# Patient Record
Sex: Female | Born: 1995 | Race: White | Hispanic: No | Marital: Married | Smoking: Never smoker
Health system: Southern US, Community
[De-identification: ages and names within clinical notes are randomized; demographics above are authoritative.]

## PROBLEM LIST (undated history)

## (undated) ENCOUNTER — Inpatient Hospital Stay (HOSPITAL_COMMUNITY): Payer: Self-pay

## (undated) DIAGNOSIS — D649 Anemia, unspecified: Secondary | ICD-10-CM

## (undated) HISTORY — DX: Anemia, unspecified: D64.9

## (undated) HISTORY — PX: NO PAST SURGERIES: SHX2092

---

## 2018-03-11 NOTE — L&D Delivery Note (Signed)
OB/GYN Faculty Practice Delivery Note  Laura Schmidt is a 23 y.o. G1P1001 s/p NSVD at [redacted]w[redacted]d. She was admitted for SROM and early labor.   ROM: 12h 75m with clear fluid GBS Status: Negative/-- (11/02 1208) Maximum Maternal Temperature: 99.3 F    Labor Progress: . Patient arrived at 2 cm dilation and initially managed expectantly. Subsequently she was 3cm and augmented with pitocin and progressed to fully dilated and had uncomplicated NSVD.   Delivery Date/Time: 02/09/2019 at 1339 Delivery: Called to room and patient was complete and pushing. Head delivered in ROA position. No nuchal cord present. Shoulder and body delivered in usual fashion. Infant with spontaneous cry, placed on mother's abdomen, dried and stimulated. Cord clamped x 2 after 1-minute delay, and cut by father of baby. Cord blood drawn. Placenta delivered spontaneously with gentle cord traction. Fundus firm with massage and Pitocin. Labia, perineum, vagina, and cervix inspected with very minor 3a laceration that skimmed through the top of the EAS capsule and was repaired in usual fashion. There was also a L labial that was bleeding and repaired with two interrupted sutures.   Placenta: 3v intact, to L&D Complications: none Lacerations: 3a, L labial, repaired EBL: 351 cc Analgesia: epidural, local   Infant: APGAR (1 MIN): 9   APGAR (5 MINS): 9    Weight: 3549 grams  Augustin Coupe, MD/[facility] OB/GYN Fellow, Faculty Practice

## 2018-08-06 ENCOUNTER — Telehealth (INDEPENDENT_AMBULATORY_CARE_PROVIDER_SITE_OTHER): Payer: Medicaid Other | Admitting: *Deleted

## 2018-08-06 ENCOUNTER — Other Ambulatory Visit: Payer: Self-pay

## 2018-08-06 DIAGNOSIS — O3680X0 Pregnancy with inconclusive fetal viability, not applicable or unspecified: Secondary | ICD-10-CM

## 2018-08-06 DIAGNOSIS — Z3490 Encounter for supervision of normal pregnancy, unspecified, unspecified trimester: Secondary | ICD-10-CM | POA: Insufficient documentation

## 2018-08-06 DIAGNOSIS — Z789 Other specified health status: Secondary | ICD-10-CM

## 2018-08-06 MED ORDER — AMBULATORY NON FORMULARY MEDICATION: 1.0000 | Status: DC

## 2018-08-06 NOTE — Progress Notes (Signed)
I connected with  Laura Schmidt on 08/06/18 at  2:15 PM EDT by telephone and verified that I am speaking with the correct person using two identifiers.   I discussed the limitations, risks, security and privacy concerns of performing an evaluation and management service by telephone and virtual  and the availability of in person appointments. I also discussed with the patient that there may be a patient responsible charge related to this service. The patient expressed understanding and agreed to proceed.  Mckinzie Saksa,RN 08/06/2018  2:05 PM

## 2018-08-06 NOTE — Progress Notes (Signed)
I connected with Laura Schmidt and her husband at her request  Via telephone and webex and interpreter via telephone. They informed me her LMP was actually 05/05/18. I informed her she would need to provide  A urine at her new ob appt for pregnancy test and that we would do all her blood work that day. Due to her mother having type 2 diabetes that we would need to do a fasting 2 hour gtt at that visit.  She c/o her feeling her heart beat fast at night  when she is lying down about 3 times per week. I discussed with Dr.Ervin and asked patient if she has counted her pulse when this happens and how long it last.  She states it lasts maybe 30 minutes and she has not counted her pulse. I advised her to count her pulse the next time it occurs and if it is 150-160 or higher and/or lasts more than one hour to call us.  She voices understanding.

## 2018-08-08 NOTE — Progress Notes (Signed)
Chart reviewed for nurse visit. Agree with plan of care.   Judeth Horn, NP 08/08/2018 7:51 AM

## 2018-08-21 DIAGNOSIS — Z3490 Encounter for supervision of normal pregnancy, unspecified, unspecified trimester: Secondary | ICD-10-CM | POA: Diagnosis not present

## 2018-08-25 ENCOUNTER — Encounter: Payer: Self-pay | Admitting: Nurse Practitioner

## 2018-08-25 ENCOUNTER — Other Ambulatory Visit: Payer: Self-pay

## 2018-08-25 ENCOUNTER — Other Ambulatory Visit: Payer: Medicaid Other

## 2018-08-25 ENCOUNTER — Ambulatory Visit (INDEPENDENT_AMBULATORY_CARE_PROVIDER_SITE_OTHER): Payer: Medicaid Other | Admitting: Nurse Practitioner

## 2018-08-25 ENCOUNTER — Other Ambulatory Visit (HOSPITAL_COMMUNITY)
Admission: RE | Admit: 2018-08-25 | Discharge: 2018-08-25 | Disposition: A | Discharge status: Home / Self Care | Payer: Medicaid Other | Source: Ambulatory Visit | Attending: Nurse Practitioner | Admitting: Nurse Practitioner

## 2018-08-25 VITALS — BP 121/76 | HR 108 | Temp 98.4°F | Wt 124.4 lb

## 2018-08-25 DIAGNOSIS — Z3482 Encounter for supervision of other normal pregnancy, second trimester: Secondary | ICD-10-CM | POA: Diagnosis not present

## 2018-08-25 DIAGNOSIS — Z3490 Encounter for supervision of normal pregnancy, unspecified, unspecified trimester: Secondary | ICD-10-CM | POA: Diagnosis not present

## 2018-08-25 DIAGNOSIS — Z3492 Encounter for supervision of normal pregnancy, unspecified, second trimester: Secondary | ICD-10-CM

## 2018-08-25 DIAGNOSIS — Z3A16 16 weeks gestation of pregnancy: Secondary | ICD-10-CM

## 2018-08-25 LAB — HEMOGLOBIN A1C
Est. average glucose Bld gHb Est-mCnc: 97 mg/dL
Hgb A1c MFr Bld: 5 % (ref 4.8–5.6)

## 2018-08-25 NOTE — Progress Notes (Signed)
Subjective:   Laura Schmidt is a 23 y.o. G1P0 at 3364w0d by LMP being seen today for her first obstetrical visit.  Her obstetrical history is significant for language barrier. Patient does intend to breast feed. Pregnancy history fully reviewed. Video interpreter used for the entire visit. Client moved to [place] from [place] in January.  Patient reports one epiisode of spotting and cramping not associated with intercourse last week and notices that her heart is racing at [place]  No fainting, no diaphoresis, no visual changes associated.  HISTORY: OB History  Gravida Para Term Preterm AB Living  1 0 0 0 0 0  SAB TAB Ectopic Multiple Live Births  0 0 0 0 0    # Outcome Date GA Lbr Len/2nd Weight Sex Delivery Anes PTL Lv  1 Current            Past Medical History:  Diagnosis Date  . Anemia    Past Surgical History:  Procedure Laterality Date  . NO PAST SURGERIES     Family History  Problem Relation Age of Onset  . Diabetes Mother    Social History   Tobacco Use  . Smoking status: Never Smoker  . Smokeless tobacco: Never Used  Substance Use Topics  . Alcohol use: Never    Frequency: Never  . Drug use: Never   No Known Allergies Current Outpatient Medications on File Prior to Visit  Medication Sig Dispense Refill  . Prenatal Vit-Fe Fumarate-FA (PRENATAL VITAMINS PO) Take 1 tablet by mouth daily.    . AMBULATORY NON FORMULARY MEDICATION 1 Device by Other route once a week. Medication Name: Blood Pressure Cuff Monitored regularly at home  ICD 10 Z34.90 (Patient not taking: Reported on 08/25/2018)     No current facility-administered medications on file prior to visit.      Exam   Vitals:   08/25/18 0848 08/25/18 0852  BP: (!) 124/92 121/76  Pulse: (!) 124 (!) 108  Temp: 98.4 F (36.9 C)   Weight: 124 lb 6.4 oz (56.4 kg)   Repeat BP is normal. Fetal Heart Rate (bpm): 154  Uterus:  Fundal Height: 15 cm  Pelvic Exam: Perineum: no hemorrhoids, normal  perineum   Vulva: normal external genitalia, no lesions   Vagina:  normal mucosa, normal discharge   Cervix: no lesions and normal, pap smear done. Cervix closed and no bleeding with pap.   Adnexa: normal adnexa and no mass, fullness, tenderness   Bony Pelvis: average  System: General: well-developed, well-nourished female in no acute distress   Breast:  normal appearance, no masses or tenderness   Skin: normal coloration and turgor, no rashes   Neurologic: oriented, normal, negative, normal mood   Extremities: normal strength, tone, and muscle mass, ROM of all joints is normal   HEENT extraocular movement intact and sclera clear, anicteric   Mouth/Teeth mucous membranes moist, pharynx normal without lesions and dental hygiene good   Neck supple and no masses, normal thyroid   Cardiovascular: regular rate and rhythm   Respiratory:  no respiratory distress, normal breath sounds   Abdomen: soft, non-tender; no masses,  no organomegaly     Assessment:   Pregnancy: G1P0 Patient Active Problem List   Diagnosis Date Noted  . Supervision of low-risk pregnancy 08/06/2018  . Language barrier 08/06/2018     Plan:  1. Encounter for supervision of low-risk pregnancy, antepartum Drink at least 8 8-oz glasses of water every day. Reports she is able to eat,  no vomiting and has ntoiced she needs to eat more frequently to avoid nausea. Continue PNV.  - Culture, OB Urine - Cystic Fibrosis Mutation 97 - Genetic Screening - Hemoglobinopathy Evaluation - Obstetric Panel, Including HIV - Inheritest(R) CF/SMA Panel - Cytology - PAP( [place])  2.  Racing heartbeat Drink at least 8 8-oz glasses of water every day. Notify the office if this symptom worsens.  3.  Spotting and cramping last week We will check for vaginal infection and urinary tract infection today. Notify the office or go to MAU if this is worsening. Reviewed entrance to use for [facility]. Has not had any problems with bleeding  this week.  Initial labs drawn. Continue prenatal vitamins. Genetic Screening discussed, First trimester screen, Quad screen and NIPS: ordered. Ultrasound discussed; fetal anatomic survey: ordered. Problem list reviewed and updated. The nature of [facility] with multiple MDs and other Advanced Practice Providers was explained to patient; also emphasized that residents, students are part of our team. Routine obstetric precautions reviewed. Return in about 4 weeks (around 09/22/2018) for Webex visit with interpreter.  Total face-to-face time with patient: 40 minutes.  Over 50% of encounter was spent on counseling and coordination of care.     Earlie Server, FNP Family Nurse Practitioner, [facility] for [employer], [facility] 08/25/2018 4:40 PM

## 2018-08-25 NOTE — Patient Instructions (Signed)

## 2018-08-26 LAB — CYTOLOGY - PAP
Chlamydia: NEGATIVE
Diagnosis: NEGATIVE
Neisseria Gonorrhea: NEGATIVE

## 2018-08-27 LAB — URINE CULTURE, OB REFLEX: Organism ID, Bacteria: NO GROWTH

## 2018-08-27 LAB — CULTURE, OB URINE

## 2018-08-31 ENCOUNTER — Telehealth: Payer: Self-pay

## 2018-08-31 ENCOUNTER — Other Ambulatory Visit: Payer: Self-pay

## 2018-08-31 NOTE — Telephone Encounter (Signed)
Contacted Pt using Arabic Interpreter Hanan id # [id number]  [employer] to let pt know that [place] advised that cuff was delivered on 08/21/18 and left on front door. Pt states never received.

## 2018-08-31 NOTE — Telephone Encounter (Signed)
Called pt to see if she received her BP cuff yet thru [facility] and she said no, advised will follow up with contact & call her back. Pot verbalized understanding.

## 2018-09-02 ENCOUNTER — Encounter: Payer: Self-pay | Admitting: *Deleted

## 2018-09-09 LAB — OBSTETRIC PANEL, INCLUDING HIV
Antibody Screen: NEGATIVE
Basophils Absolute: 0.1 10*3/uL (ref 0.0–0.2)
Basos: 1 %
EOS (ABSOLUTE): 0 10*3/uL (ref 0.0–0.4)
Eos: 0 %
HIV Screen 4th Generation wRfx: NONREACTIVE
Hematocrit: 34.1 % (ref 34.0–46.6)
Hemoglobin: 11.7 g/dL (ref 11.1–15.9)
Hepatitis B Surface Ag: NEGATIVE
Immature Grans (Abs): 0 10*3/uL (ref 0.0–0.1)
Immature Granulocytes: 0 %
Lymphocytes Absolute: 1.9 10*3/uL (ref 0.7–3.1)
Lymphs: 16 %
MCH: 27.2 pg (ref 26.6–33.0)
MCHC: 34.3 g/dL (ref 31.5–35.7)
MCV: 79 fL (ref 79–97)
Monocytes Absolute: 0.7 10*3/uL (ref 0.1–0.9)
Monocytes: 6 %
Neutrophils Absolute: 9 10*3/uL — ABNORMAL HIGH (ref 1.4–7.0)
Neutrophils: 77 %
Platelets: 280 10*3/uL (ref 150–450)
RBC: 4.3 x10E6/uL (ref 3.77–5.28)
RDW: 15 % (ref 11.7–15.4)
RPR Ser Ql: NONREACTIVE
Rh Factor: POSITIVE
Rubella Antibodies, IGG: 21.4 index (ref >0.99)
WBC: 11.7 10*3/uL — ABNORMAL HIGH (ref 3.4–10.8)

## 2018-09-09 LAB — INHERITEST(R) CF/SMA PANEL

## 2018-09-09 LAB — HEMOGLOBINOPATHY EVALUATION
Ferritin: 11 ng/mL — ABNORMAL LOW (ref 15–150)
Hgb A2 Quant: 2.5 % (ref 1.8–3.2)
Hgb A: 97.5 % (ref 96.4–98.8)
Hgb C: 0 %
Hgb F Quant: 0 % (ref 0.0–2.0)
Hgb S: 0 %
Hgb Solubility: NEGATIVE
Hgb Variant: 0 %

## 2018-09-15 ENCOUNTER — Ambulatory Visit (HOSPITAL_COMMUNITY)
Admission: RE | Admit: 2018-09-15 | Discharge: 2018-09-15 | Disposition: A | Discharge status: Home / Self Care | Payer: Medicaid Other | Source: Ambulatory Visit | Attending: Student | Admitting: Student

## 2018-09-15 ENCOUNTER — Other Ambulatory Visit: Payer: Self-pay

## 2018-09-15 ENCOUNTER — Other Ambulatory Visit (HOSPITAL_COMMUNITY): Payer: Self-pay | Admitting: *Deleted

## 2018-09-15 DIAGNOSIS — Z3490 Encounter for supervision of normal pregnancy, unspecified, unspecified trimester: Secondary | ICD-10-CM | POA: Insufficient documentation

## 2018-09-15 DIAGNOSIS — Z363 Encounter for antenatal screening for malformations: Secondary | ICD-10-CM

## 2018-09-15 DIAGNOSIS — Z3A19 19 weeks gestation of pregnancy: Secondary | ICD-10-CM

## 2018-09-15 DIAGNOSIS — Z362 Encounter for other antenatal screening follow-up: Secondary | ICD-10-CM

## 2018-09-22 ENCOUNTER — Ambulatory Visit (INDEPENDENT_AMBULATORY_CARE_PROVIDER_SITE_OTHER): Payer: Medicaid Other

## 2018-09-22 ENCOUNTER — Telehealth: Payer: Self-pay | Admitting: Obstetrics and Gynecology

## 2018-09-22 ENCOUNTER — Telehealth: Payer: Self-pay | Admitting: General Practice

## 2018-09-22 ENCOUNTER — Telehealth: Payer: Self-pay

## 2018-09-22 ENCOUNTER — Other Ambulatory Visit: Payer: Medicaid Other

## 2018-09-22 ENCOUNTER — Other Ambulatory Visit: Payer: Self-pay | Admitting: General Practice

## 2018-09-22 ENCOUNTER — Other Ambulatory Visit: Payer: Self-pay

## 2018-09-22 DIAGNOSIS — Z3492 Encounter for supervision of normal pregnancy, unspecified, second trimester: Secondary | ICD-10-CM

## 2018-09-22 DIAGNOSIS — Z3490 Encounter for supervision of normal pregnancy, unspecified, unspecified trimester: Secondary | ICD-10-CM

## 2018-09-22 DIAGNOSIS — Z789 Other specified health status: Secondary | ICD-10-CM

## 2018-09-22 DIAGNOSIS — Z3A20 20 weeks gestation of pregnancy: Secondary | ICD-10-CM

## 2018-09-22 NOTE — Telephone Encounter (Signed)
Called pt to advise of new [place] appt scheduled for 11/17/18 at 8:45am, but then she was asking if she could get her Antibody test at the Pharmacy & that she has sent messages thru My Chart & was told that she could go thru Pharmacy. T/W Morey Hummingbird, & she stated that she could come here at the office or the Ivor decided to come here, Lab appt at 1:50p. Pt verbalized understanding. Used Radio broadcast assistant Pareed id # [id number] with [employer].

## 2018-09-22 NOTE — Telephone Encounter (Signed)
A friend of the patient called and left message on nurse voicemail line stating the patient needs the antibody test for COVID because she is traveling next week & needs it before she leaves.   Called patient with pacific interpreter [phone], no answer on either number and I was unable to leave a message.

## 2018-09-22 NOTE — Progress Notes (Signed)
   TELEHEALTH VIRTUAL OBSTETRICS VISIT ENCOUNTER NOTE  I connected with Laura Schmidt on 09/22/18 at  9:35 AM EDT by WebEx at home and verified that I am speaking with the correct person using two identifiers.   I discussed the limitations, risks, security and privacy concerns of performing an evaluation and management service by telephone and the availability of in person appointments. I also discussed with the patient that there may be a patient responsible charge related to this service. The patient expressed understanding and agreed to proceed.  Subjective:  Laura Schmidt is a 22 y.o. G1P0 at [redacted]w[redacted]d being followed for ongoing prenatal care.  She is currently monitored for the following issues for this low-risk pregnancy and has Supervision of low-risk pregnancy and Language barrier on their problem list.  Patient reports no complaints. Reports fetal movement. Denies any contractions, bleeding or leaking of fluid.   The following portions of the patient's history were reviewed and updated as appropriate: allergies, current medications, past family history, past medical history, past social history, past surgical history and problem list.   Objective:   General:  Alert, oriented and cooperative.   Mental Status: Normal mood and affect perceived. Normal judgment and thought content.  Rest of physical exam deferred due to type of encounter  Assessment and Plan:  Pregnancy: G1P0 at [redacted]w[redacted]d 1. Encounter for supervision of low-risk pregnancy, antepartum -Patient never received BP cuff. RN to reorder and call pharmacy. -Patient is leaving to be with mother who is having open heart surgery and will not be able to keep appointment for u/s. Will rescheule u/s and schedule next visit for 28 weeks in person. Anticipatory guidance provided. -Reviewed NOB labs and genetic screening. Low risk female!  2. Language barrier -Arabic interpreter used  Preterm labor symptoms and general  obstetric precautions including but not limited to vaginal bleeding, contractions, leaking of fluid and fetal movement were reviewed in detail with the patient.  I discussed the assessment and treatment plan with the patient. The patient was provided an opportunity to ask questions and all were answered. The patient agreed with the plan and demonstrated an understanding of the instructions. The patient was advised to call back or seek an in-person office evaluation/go to [facility]U at [facility] for any urgent or concerning symptoms. Please refer to After Visit Summary for other counseling recommendations.   I provided 10 minutes of non-face-to-face time during this encounter.  Return in about 8 weeks (around 11/17/2018) for Return OB visit, 2hr GTT and labs.  Future Appointments  Date Time Provider [place]  10/13/2018  8:45 AM WH-MFC [place] 2 WH-MFCUS MFC-US    Amberlyn Martinezgarcia M Taliyah Watrous, [place] for [employer], [place]

## 2018-09-23 LAB — SARS-COV-2 ANTIBODY, IGM: SARS-CoV-2 Antibody, IgM: NEGATIVE

## 2018-09-24 NOTE — Telephone Encounter (Signed)
Opened in error

## 2018-09-28 ENCOUNTER — Other Ambulatory Visit: Payer: Self-pay | Admitting: *Deleted

## 2018-09-28 ENCOUNTER — Telehealth (INDEPENDENT_AMBULATORY_CARE_PROVIDER_SITE_OTHER): Payer: Medicaid Other | Admitting: General Practice

## 2018-09-28 DIAGNOSIS — Z20822 Contact with and (suspected) exposure to covid-19: Secondary | ICD-10-CM

## 2018-09-28 DIAGNOSIS — Z3492 Encounter for supervision of normal pregnancy, unspecified, second trimester: Secondary | ICD-10-CM

## 2018-09-28 NOTE — Telephone Encounter (Signed)
Called patient with pacific interpreter [phone] & informed her of negative antibody test results. Patient verbalized understanding and asked we speak with her husband about covid testing sites. Provided her husband information about mobile testing today. He verbalized understanding. The patient had no other questions.  Removed mychart access from patient account. Patient should not have mychart access as she does not speak/read [place]. Patient's family members were using her account to send messages/view results.

## 2018-09-28 NOTE — Addendum Note (Signed)
Addended by: Brigitte Pulse on: 09/28/2018 08:14 PM   Modules accepted: Orders

## 2018-10-01 DIAGNOSIS — Z1159 Encounter for screening for other viral diseases: Secondary | ICD-10-CM | POA: Diagnosis not present

## 2018-10-02 LAB — NOVEL CORONAVIRUS, NAA: SARS-CoV-2, NAA: NOT DETECTED

## 2018-10-13 ENCOUNTER — Ambulatory Visit (HOSPITAL_COMMUNITY): Payer: Medicaid Other

## 2018-10-20 ENCOUNTER — Telehealth: Payer: Self-pay | Admitting: Advanced Practice Midwife

## 2018-10-20 ENCOUNTER — Encounter: Payer: Self-pay | Admitting: Advanced Practice Midwife

## 2018-10-20 ENCOUNTER — Ambulatory Visit: Payer: Medicaid Other | Admitting: Advanced Practice Midwife

## 2018-10-20 ENCOUNTER — Encounter: Payer: Medicaid Other | Admitting: Advanced Practice Midwife

## 2018-10-20 ENCOUNTER — Other Ambulatory Visit: Payer: Self-pay

## 2018-10-20 DIAGNOSIS — Z5329 Procedure and treatment not carried out because of patient's decision for other reasons: Secondary | ICD-10-CM

## 2018-10-20 DIAGNOSIS — Z91199 Patient's noncompliance with other medical treatment and regimen due to unspecified reason: Secondary | ICD-10-CM

## 2018-10-20 NOTE — Progress Notes (Signed)
@  1055am no answer Interpreter said that the number is changed or missing @1104am  no answer and message stating that the number is changed or missing

## 2018-10-20 NOTE — Telephone Encounter (Signed)
Attempted to call patient w/ Arabic interpreter ID # [phone] about her missed appointment on 8/11. No answer and could not leave a voicemail because it was not set-up. No show letter mailed.

## 2018-10-22 NOTE — Patient Instructions (Signed)
Unable to complete appt due to phone number not working.

## 2018-10-28 ENCOUNTER — Telehealth: Payer: Self-pay | Admitting: Family Medicine

## 2018-10-28 NOTE — Telephone Encounter (Signed)
Attempted to reach Ms. Demetrius Charity with Interpreter [phone]. Was not able to leave a voicemail message.

## 2018-11-17 ENCOUNTER — Ambulatory Visit (HOSPITAL_COMMUNITY)
Admission: RE | Admit: 2018-11-17 | Discharge: 2018-11-17 | Disposition: A | Discharge status: Home / Self Care | Payer: Medicaid Other | Source: Ambulatory Visit | Attending: Maternal & Fetal Medicine | Admitting: Maternal & Fetal Medicine

## 2018-11-17 ENCOUNTER — Other Ambulatory Visit (HOSPITAL_COMMUNITY): Payer: Self-pay | Admitting: *Deleted

## 2018-11-17 ENCOUNTER — Other Ambulatory Visit: Payer: Self-pay

## 2018-11-17 DIAGNOSIS — IMO0001 Reserved for inherently not codable concepts without codable children: Secondary | ICD-10-CM

## 2018-11-17 DIAGNOSIS — Z362 Encounter for other antenatal screening follow-up: Secondary | ICD-10-CM | POA: Insufficient documentation

## 2018-11-17 DIAGNOSIS — Z3A28 28 weeks gestation of pregnancy: Secondary | ICD-10-CM

## 2018-11-18 ENCOUNTER — Other Ambulatory Visit: Payer: Medicaid Other | Admitting: Medical

## 2018-11-18 ENCOUNTER — Other Ambulatory Visit: Payer: Medicaid Other

## 2018-11-18 ENCOUNTER — Other Ambulatory Visit: Payer: Self-pay | Admitting: *Deleted

## 2018-11-18 DIAGNOSIS — Z3490 Encounter for supervision of normal pregnancy, unspecified, unspecified trimester: Secondary | ICD-10-CM

## 2018-11-23 ENCOUNTER — Telehealth: Payer: Self-pay | Admitting: Advanced Practice Midwife

## 2018-11-23 ENCOUNTER — Encounter: Payer: Medicaid Other | Admitting: Advanced Practice Midwife

## 2018-11-23 ENCOUNTER — Other Ambulatory Visit: Payer: Medicaid Other

## 2018-11-23 NOTE — Telephone Encounter (Signed)
Arabic interpreter Bedor called patient about her missed appointment on 9/14. Patient stated she would like to be rescheduled for 10/5. Patient was rescheduled for 10/5 @ 8:15.

## 2018-12-04 ENCOUNTER — Telehealth: Payer: Self-pay | Admitting: Family Medicine

## 2018-12-04 NOTE — Telephone Encounter (Signed)
Attempted to call patient w/ Arabic interpreter Laura Schmidt about her appointment on 9/28 @ 9:15. No answer,the number stated that the call could not be completed.

## 2018-12-07 ENCOUNTER — Telehealth: Payer: Self-pay | Admitting: Obstetrics and Gynecology

## 2018-12-07 ENCOUNTER — Encounter: Payer: Medicaid Other | Admitting: Family Medicine

## 2018-12-07 ENCOUNTER — Encounter: Payer: Self-pay | Admitting: Family Medicine

## 2018-12-07 NOTE — Progress Notes (Signed)
Patient did not keep appointment today. She will be called to reschedule.  

## 2018-12-07 NOTE — Telephone Encounter (Signed)
Called the patient to inform of the missed appointment with the in office interpreter. The patient's husband stated they called to cancel the appointment Sunday night and rescheduled for October 5th.

## 2018-12-14 ENCOUNTER — Encounter: Payer: Self-pay | Admitting: Certified Nurse Midwife

## 2018-12-14 ENCOUNTER — Other Ambulatory Visit: Payer: Self-pay

## 2018-12-14 ENCOUNTER — Ambulatory Visit (INDEPENDENT_AMBULATORY_CARE_PROVIDER_SITE_OTHER): Payer: Medicaid Other | Admitting: Certified Nurse Midwife

## 2018-12-14 ENCOUNTER — Other Ambulatory Visit: Payer: Medicaid Other

## 2018-12-14 VITALS — BP 114/76 | HR 116 | Temp 98.3°F | Wt 148.2 lb

## 2018-12-14 DIAGNOSIS — Z23 Encounter for immunization: Secondary | ICD-10-CM | POA: Diagnosis not present

## 2018-12-14 DIAGNOSIS — Z3493 Encounter for supervision of normal pregnancy, unspecified, third trimester: Secondary | ICD-10-CM

## 2018-12-14 DIAGNOSIS — O358XX0 Maternal care for other (suspected) fetal abnormality and damage, not applicable or unspecified: Secondary | ICD-10-CM

## 2018-12-14 DIAGNOSIS — Z3490 Encounter for supervision of normal pregnancy, unspecified, unspecified trimester: Secondary | ICD-10-CM | POA: Diagnosis not present

## 2018-12-14 DIAGNOSIS — Z3A31 31 weeks gestation of pregnancy: Secondary | ICD-10-CM | POA: Diagnosis not present

## 2018-12-14 DIAGNOSIS — Z789 Other specified health status: Secondary | ICD-10-CM

## 2018-12-14 NOTE — Progress Notes (Signed)
Pt states having some pelvic pain

## 2018-12-14 NOTE — Patient Instructions (Signed)

## 2018-12-14 NOTE — Progress Notes (Signed)
   PRENATAL VISIT NOTE  Subjective:  Laura Schmidt is a 23 y.o. G1P0 at [redacted]w[redacted]d being seen today for ongoing prenatal care.  She is currently monitored for the following issues for this low-risk pregnancy and has Supervision of low-risk pregnancy and Language barrier on their problem list.  Patient reports no complaints.  Contractions: Not present. Vag. Bleeding: None.  Movement: Present. Denies leaking of fluid.   The following portions of the patient's history were reviewed and updated as appropriate: allergies, current medications, past family history, past medical history, past social history, past surgical history and problem list.   Objective:   Vitals:   12/14/18 0832  BP: 114/76  Pulse: (!) 116  Temp: 98.3 F (36.8 C)  Weight: 148 lb 3.2 oz (67.2 kg)    Fetal Status: Fetal Heart Rate (bpm): 146   Movement: Present     General:  Alert, oriented and cooperative. Patient is in no acute distress.  Skin: Skin is warm and dry. No rash noted.   Cardiovascular: Normal heart rate noted  Respiratory: Normal respiratory effort, no problems with respiration noted  Abdomen: Soft, gravid, appropriate for gestational age.  Pain/Pressure: Present     Pelvic: Cervical exam deferred        Extremities: Normal range of motion.  Edema: None  Mental Status: Normal mood and affect. Normal behavior. Normal judgment and thought content.   Assessment and Plan:  Pregnancy: G1P0 at [redacted]w[redacted]d 1. Encounter for supervision of low-risk pregnancy in third trimester - Patient doing well, reports pelvic pain with walking  - Fetus vertex, encouraged use of maternity support belt   - Routine prenatal care - Anticipatory guidance on upcoming appointments  - Patient has missed last appointments- has not received 2hr GTT, patient reports eating this morning - Discussed 1hrGTT with patient, educated that if she fails 1hr she would need additional testing or screening.  - 1hrGTT and third trimester labs  obtained  - Tdap vaccine greater than or equal to 7yo IM - Glucose, 1 hour gestational  2. Language barrier - Interpreter at bedside  3. 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 Please refer to After Visit Summary for other counseling recommendations.   Return in about 4 weeks (around 01/11/2019) for ROB-LROB/GBS.  Future Appointments  Date Time Provider [place]  12/14/2018  9:30 AM WOC-WOCA LAB WOC-WOCA WOC  12/29/2018  9:00 AM WH-MFC NURSE [place] MFC-US  12/29/2018  9:00 AM WH-MFC [place] 3 WH-MFCUS MFC-US    Carvel Huskins C Dollie Mayse, CNM

## 2018-12-15 LAB — CBC
Hematocrit: 35.8 % (ref 34.0–46.6)
Hemoglobin: 12.2 g/dL (ref 11.1–15.9)
MCH: 29.9 pg (ref 26.6–33.0)
MCHC: 34.1 g/dL (ref 31.5–35.7)
MCV: 88 fL (ref 79–97)
Platelets: 255 10*3/uL (ref 150–450)
RBC: 4.08 x10E6/uL (ref 3.77–5.28)
RDW: 16.5 % — ABNORMAL HIGH (ref 11.7–15.4)
WBC: 13.1 10*3/uL — ABNORMAL HIGH (ref 3.4–10.8)

## 2018-12-15 LAB — RPR: RPR Ser Ql: NONREACTIVE

## 2018-12-15 LAB — GLUCOSE, 1 HOUR GESTATIONAL: Gestational Diabetes Screen: 106 mg/dL (ref 65–139)

## 2018-12-15 LAB — HIV ANTIBODY (ROUTINE TESTING W REFLEX): HIV Screen 4th Generation wRfx: NONREACTIVE

## 2018-12-29 ENCOUNTER — Other Ambulatory Visit: Payer: Self-pay

## 2018-12-29 ENCOUNTER — Encounter (HOSPITAL_COMMUNITY): Payer: Self-pay

## 2018-12-29 ENCOUNTER — Ambulatory Visit (HOSPITAL_COMMUNITY)
Admission: RE | Admit: 2018-12-29 | Discharge: 2018-12-29 | Disposition: A | Discharge status: Home / Self Care | Payer: Medicaid Other | Source: Ambulatory Visit | Attending: Obstetrics and Gynecology | Admitting: Obstetrics and Gynecology

## 2018-12-29 ENCOUNTER — Ambulatory Visit (HOSPITAL_COMMUNITY): Payer: Medicaid Other | Admitting: *Deleted

## 2018-12-29 DIAGNOSIS — Z3A34 34 weeks gestation of pregnancy: Secondary | ICD-10-CM | POA: Diagnosis not present

## 2018-12-29 DIAGNOSIS — Z362 Encounter for other antenatal screening follow-up: Secondary | ICD-10-CM | POA: Insufficient documentation

## 2018-12-29 DIAGNOSIS — Z789 Other specified health status: Secondary | ICD-10-CM

## 2018-12-29 DIAGNOSIS — O358XX0 Maternal care for other (suspected) fetal abnormality and damage, not applicable or unspecified: Secondary | ICD-10-CM

## 2018-12-29 DIAGNOSIS — IMO0001 Reserved for inherently not codable concepts without codable children: Secondary | ICD-10-CM

## 2019-01-11 ENCOUNTER — Other Ambulatory Visit (HOSPITAL_COMMUNITY)
Admission: RE | Admit: 2019-01-11 | Discharge: 2019-01-11 | Disposition: A | Discharge status: Home / Self Care | Payer: Medicaid Other | Source: Ambulatory Visit | Attending: Student | Admitting: Student

## 2019-01-11 ENCOUNTER — Other Ambulatory Visit: Payer: Self-pay

## 2019-01-11 ENCOUNTER — Ambulatory Visit (INDEPENDENT_AMBULATORY_CARE_PROVIDER_SITE_OTHER): Payer: Medicaid Other | Admitting: Student

## 2019-01-11 VITALS — BP 123/67 | HR 121 | Wt 154.0 lb

## 2019-01-11 DIAGNOSIS — Z3493 Encounter for supervision of normal pregnancy, unspecified, third trimester: Secondary | ICD-10-CM

## 2019-01-11 DIAGNOSIS — Z3A35 35 weeks gestation of pregnancy: Secondary | ICD-10-CM | POA: Diagnosis not present

## 2019-01-11 DIAGNOSIS — Z789 Other specified health status: Secondary | ICD-10-CM

## 2019-01-11 NOTE — Progress Notes (Signed)
   PRENATAL VISIT NOTE  Subjective:  Laura Schmidt is a 23 y.o. G1P0 at [redacted]w[redacted]d being seen today for ongoing prenatal care.  She is currently monitored for the following issues for this low-risk pregnancy and has Supervision of low-risk pregnancy; Language barrier; and Umbilical vein abnormality affecting pregnancy on their problem list.  Patient reports no complaints.  Contractions: Not present. Vag. Bleeding: None.  Movement: Present. Denies leaking of fluid.   The following portions of the patient's history were reviewed and updated as appropriate: allergies, current medications, past family history, past medical history, past social history, past surgical history and problem list.   Objective:   Vitals:   01/11/19 0952  BP: 123/67  Pulse: (!) 121  Weight: 154 lb (69.9 kg)    Fetal Status: Fetal Heart Rate (bpm): 148 Fundal Height: 37 cm Movement: Present  Presentation: Vertex  General:  Alert, oriented and cooperative. Patient is in no acute distress.  Skin: Skin is warm and dry. No rash noted.   Cardiovascular: Normal heart rate noted  Respiratory: Normal respiratory effort, no problems with respiration noted  Abdomen: Soft, gravid, appropriate for gestational age.  Pain/Pressure: Present     Pelvic: Cervical exam performed Dilation: Closed Effacement (%): 0 Station: -3  Extremities: Normal range of motion.  Edema: None  Mental Status: Normal mood and affect. Normal behavior. Normal judgment and thought content.   Assessment and Plan:  Pregnancy: G1P0 at [redacted]w[redacted]d 1. Encounter for supervision of low-risk pregnancy in third trimester -doing well -reviewed upcoming visits -given list of peds - Culture, beta strep (group b only) - Cervicovaginal ancillary only( [place])  2. Language barrier -Arabic interpreter at bedside  Term labor symptoms and general obstetric precautions including but not limited to vaginal bleeding, contractions, leaking of fluid and fetal  movement were reviewed in detail with the patient. Please refer to After Visit Summary for other counseling recommendations.   Return in about 1 week (around 01/18/2019) for Routine OB virtual.  No future appointments.  Jorje Guild, NP

## 2019-01-11 NOTE — Patient Instructions (Addendum)
AREA PEDIATRIC/FAMILY PRACTICE PHYSICIANS  [place] ([address]) . [facility] o Chambliss, MD; Eniola, MD; Hale, MD; Hensel, MD; McDiarmid, MD; McIntyer, MD; Neal, MD; Walden, MD o [address] [place], [place] [address] o [phone] o Mon-Fri 8:30-12:30, 1:30-5:00 o Providers come to see babies at [facility] o Accepting Medicaid . [facility] o Limited providers who accept newborns: Koirala, MD; Morrow, MD; Wolters, MD o [address], [place], [place] [address] o [phone] o Mon-Fri 8:00-5:30 o Babies seen by providers at [facility] o Does NOT accept Medicaid o Please call early in hospitalization for appointment (limited availability)  . [facility] o [place], MD o [address] [place], [place] [address] o [phone] o Mon, Tue, Thur, Fri 8:30-5:00, Wed 10:00-7:00 (closed 1-2pm) o Babies seen by [facility] providers o Accepting Medicaid . Rubin - Pediatrician o Rubin, MD o [address], [place], [place] [address] o [phone] o Mon-Fri 8:30-5:00, Sat 8:30-12:00 o Provider comes to see babies at [facility] o Accepting Medicaid o Must have been referred from current patients or contacted office prior to delivery . [facility] o Brown, MD; Chandler, MD; Ettefagh, MD; Grant, MD; Lester, MD; McCormick, MD; McQueen, MD; Prose, MD; Simha, MD; Stanley, MD; Stryffeler, NP; Tebben, NP o [address], [place], [place] [address] o [phone] o Mon, Tue, Thur, Fri 8:30-5:30, Wed 9:30-5:30, Sat 8:30-12:30 o Babies seen by [facility] providers o Accepting Medicaid o Only accepting infants of first-time parents or siblings of current patients o Hospital discharge coordinator will make follow-up appointment . Jack Amos o [address],  [place], [place]  [address] o [phone]   Fax - [phone] . [facility] o [address], [place], [place]  [address] o Phone - [phone]   Fax - [phone] . Shilpa Gosrani o [address], [place], [place]  [address] o [phone]  [place] ([address]) . [facility] o Bates, MD; [place]ass[place], MD; Cooper, Cox, MD; MD; Davis, MD; Dovico, MD; Ettefaugh, MD; Little, MD; Lowe, MD; Keiffer, MD; Melvin, MD; Sumner, MD; Williams, MD o [address], [place], [place] [address] o [phone] o Mon-Fri 8:30-5:00 (extended evenings Mon-Thur as needed), Sat-Sun 10:00-1:00 o Providers come to see babies at [facility] o Accepting Medicaid for families of first-time babies and families with all children in the household age 3 and under. Must register with office prior to making appointment (M-F only). . [facility] o Henson, NP; Knapp, MD; Lalonde, MD; Tysinger, PA o [address] [place], [place] [address] o [phone] o Mon-Fri 8:00-5:00 o Babies seen by providers at [facility] o Does NOT accept Medicaid/Commercial Insurance Only . [facility] [place] ([facility])  o Artis, MD; Barnes, MD; Bratton, MD; Coccaro, MD; Lockett Gardner, MD; Kramer, MD; Marshall, MD; Netherton, MD; Poleto, MD; Skinner, MD o [address] [place], [place] [address] o [phone] o Mon-Fri 8:30-5:30, Sat (Oct.-Mar.) 9:00-1:00 o Babies seen by providers at [facility] o Accepting Medicaid  [place] ([address]) . [facility] o Reid, MD; Warner, MD o [address], [place],  [address] o [phone] o Mon-Fri 8:30-5:00, Sat 8:30-12:00 o Providers come to see babies at [facility] o Does NOT accept Medicaid . [facility] Ricci Barker, PA; Mannie Stabile, MD; [place], [place]; Nancy Fetter, MD; Moreen Fowler, [place],  [place], [place] [address] o [phone] o Mon-Fri 8:00-5:00 o Babies seen by providers at [facility] o Does NOT accept Medicaid o Only accepting babies of parents who are patients o Please call early in hospitalization for appointment (limited availability) . [facility] Pediatricians Blanca Friend, MD; Sharlene Motts, MD; Rod Can, MD; Warner Mccreedy, NP; Sabra Heck, MD; Ermalinda Memos, MD; Sharlett Iles, NP; Aurther Loft, MD; Jerrye Beavers, MD; Marcello Moores, MD; Berline Lopes, MD; Charolette Forward, MD o [place]. [place], [place], [place] [address] o [phone] o Mon-Fri 8:00-5:00, Sat 9:00-12:00 o Providers come to see babies at [facility] o Does NOT accept [facility] [phone]) . [facility] [place] providers accepting new patients: Dayna Ramus, NP; [place], [place], [place], [place] [address] o [phone] o Mon-Fri 8:00-5:00 o Babies seen by providers at [facility] o Does NOT accept Medicaid o Only accepting babies of parents who are patients o Please call early in hospitalization for appointment (limited availability) . [facility] Oswaldo Conroy, MD; Sheran Lawless, MD o [address] [place], [place] [address] o [phone] (press 1 to schedule appointment) o Mon-Fri 8:00-5:00 o Providers come to see babies at [facility] o Does NOT accept Medicaid . [facility] Jodi Mourning, MD o [address] [place], [place] [address] o [phone] o Mon-Fri 8:30-5:00 (lunch 12:30-1:00), extended hours by appointment only Wed 5:00-6:30 o Babies seen by [facility] providers o Accepting Medicaid . [place] at Evalyn Casco, MD; [place], MD; Ethlyn Gallery, MD o [place], [place], [place] [address] o [phone] o Mon-Fri 8:00-5:00 o Babies seen by [facility] providers o Does NOT accept Medicaid . Therapist, music at [place], MD; Yong Channel, MD; [place], [place] [address] [place], [place] [address] o  [phone] o Mon-Fri 8:00-5:00 o Babies seen by [facility] providers o Does NOT accept Medicaid . [place], [place]; [place], [place]; [place], NP; Albertina Parr, MD; Frederic Jericho, MD; Ronney Lion, MD; Carlos Levering, NP; Jerelene Redden, NP; Tomasita Crumble, NP; Ronelle Nigh, NP; Corinna Lines, MD; [place], MD o [address] [place], [place] [address] o [phone] o Mon-Fri 8:30-5:00, Sat 10:00-1:00 o Providers come to see babies at [facility] o Does NOT accept Medicaid o Free prenatal information session Tuesdays at 4:45pm . [facility] Porfirio Oar, MD; [place], [place]; [place], [place]; [place], [place]., [place] [address] o [phone] o Mon-Fri 7:30-5:30 o Babies seen by [facility] providers . [facility] Children's Doctor o [address], [place], [place], [place]  [address] o [phone]   Fax - [phone]  [place] [phone] & [phone]) . [place], MD o [address] [place], [place] [address] o [phone] o Mon-Thur 8:00-6:00 o Providers come to see babies at [place] Medicaid . [place], NP; Melford Aase, MD; [place], [place]; [place], [address] [place], [place] [address] o [phone] o Mon-Thur 7:30-7:30, Fri 7:30-4:30 o Babies seen by [facility] providers o Accepting Medicaid . [facility] Nyra Jabs, MD; Cristino Martes, NP; Gertie Baron, MD o [place] [address], [place], [place] [address] o [phone] o Mon-Fri 8:30-5:00, Sat 8:30-12:00 o Providers come to see babies at [place] Medicaid o Must have "Meet & Greet" appointment at office prior to delivery . [place] ([place]) o Hubbard,  MD; Juleen China, MD; Clydene Laming, [place] [address], [place], [place] [address] o [phone] o Mon-Wed 8:00-6:00, Thur-Fri 8:00-5:00, Sat 9:00-12:00 o Providers come to see  babies at [facility] o Does NOT accept Medicaid o Only accepting siblings of current patients . [facility], [place], [place], [place]  [address] o [phone]   Fax [phone] . [place] [facility] [place] [address], [place], [place]  [address] o [phone]   Fax - Artois Craig [phone] & [phone]) . Therapist, music at [place], DO; [place], [address] [place], [place] [address] o [phone] o Mon-Fri 7:00-5:00 o Babies seen by [facility] providers o Does NOT accept Medicaid . [place], MD; [place], [place]; [place], [place], [place], [place] [address] o [phone] o Mon-Fri 8:00-5:00 o Babies seen by [facility] providers o Accepting Medicaid . Reed, MD; [place], [place]; [place], NP; [place], [place], [place], [place] [address] o [phone] o Mon-Fri 8:00-5:00 o Babies seen by providers at [place] [phone]) . [facility] [place], [place] o [address] [place], [place] [address] o [phone] o Mon-Fri 8:00-5:00 o Babies seen by [facility] providers o Does NOT accept Medicaid o Limited availability, please call early in hospitalization to schedule follow-up . [facility] Leilani Merl, PA; Maisie Fus, MD; [place], MD; [place], [place]; Jeannine Kitten, MD; [place], [place]y [facility] [address], [place], [place] [address] o [phone] o Mon-Fri 8:30-5:00, Sat 9:00-12:00 o Babies seen by providers at [facility] o Accepting Medicaid o Please register online then schedule online or call office o [email or web address] . [place] [facility] [facility] [place]) Kristian Covey, NP; Dwyane Dee, MD; Leonidas Romberg, PA o [address] [place], [place], [place] [address] o [phone] o Mon-Fri 8:00-5:00 o Babies seen by providers at [facility] o Accepting Medicaid . [place] [facility]) Dairl Ponder, MD; Rayvon Char, NP; Melina Modena, MD o [address] [place], [place], [place] [address] o [phone] o Mon-Fri 8:00-5:30, Sat&Sun by appointment (phones open at 8:30) o Babies seen by [facility] providers o Accepting Medicaid o Must be a first-time baby or sibling of current patient . [place], [address], [place], [place]  [address] o [phone]   Fax - [phone]  [place] [phone] & [phone]) . [place], [place]; [place], [place]; Benjamine Mola, MD; [place], [place]; Harrell Lark, MD o [address], [place], [place] [address] o [phone] o Mon-Thur 8:00-7:00, Fri 8:00-5:00, Sat 8:00-12:00, Sun 9:00-12:00 o Babies seen by [facility] providers o Accepting Medicaid . [facility], MD; Ruthann Cancer, MD; [facility], MD o [address], [place],  [address] o [phone] o Mon-Thur 8:00-5:00 o Babies seen by providers at [facility] o Accepting Medicaid . [facility] at [place], MD; Coe-Goins, MD; Amedeo Plenty, MD; Bobby Rumpf, MD; List, MD; Lavonia Drafts, MD; Ruthann Cancer, MD; Selinda Eon, MD; Audie Box, MD; Jim Like, MD; Christie Nottingham, MD; [place] Hart[place], MD; [place] Nunnery, MD o [address] [place], [place]  [address] o [phone] o Mon-Fri 8:00-5:30, Sat (Oct.-Mar.) 9:00-1:00 o Babies seen by providers at [facility] o Accepting Medicaid o Must fill out new patient packet, available online at [email or web address] . [facility] [place] [facility] [facility] at [facility]) Simone Curia, NP; Tiburcio Pea, NP; Tresa Endo, NP; Whitney Post, MD; [place], [place];  Hennie Duos, MD; Wynne Dust, MD; Kavin Leech, NP o [address], [place], [place] [address] o [phone] o Mon-Thur 8:00-5:30, Fri 8:00-5:00 o Babies seen by providers at [facility] o Accepting [facility] [phone]) . [facility] o [place], [place]; [place], MD; Tanya Nones, MD; [place], [place] o [address] [place], [place] [address] o [phone] o Mon-Fri 8:00-5:00 o Babies seen by providers at [facility] o Accepting [facility] [phone]) . [facility] at [facility] o Sherman, DO; Lenise Arena, MD; [place], [place] o [address], [place], [place] [address] o [phone] o Mon-Fri 8:00-5:00 o Babies seen by providers at [facility] o Does NOT accept Medicaid o Limited appointment availability, please call early in hospitalization  . Nature conservation officer at [facility] o Mitchell, DO; [place], MD o [address] [place], [place] [address] o [phone] o Mon-Fri 8:00-5:00 o Babies seen by [facility] providers o Does NOT accept Medicaid . [facility] Lorrine Kin, MD; Ninetta Lights, MD; [place], [place]; [place], MD o [address], [place], [place] [address] o [phone] o Mon-Fri 8:00-5:00 o After hours clinic [facility] [place], [place] [address]) [phone] Mon-Fri 5:00-8:00, Sat 12:00-6:00, Sun 10:00-4:00 o Babies seen by [facility] providers o Accepting Medicaid . [facility] at [facility] o [address] [place], [place]  [address] o [phone]   Fax - [phone]  [place] [phone]) . Nature conservation officer at [facility], MD o [address] [place], [place], [place] [address] o [phone] o Mon-Fri 8:00-5:00 o Babies seen by [facility] providers o Does NOT accept Medicaid . [facility] - [place] [facility] [place]) Tomi Likens, MD o [address] [place] [address], [place], [place] [address] o [phone] o  Mon-Thur 8:00-7:00, Fri 8:00-5:00, Sat 8:00-12:00 o Babies seen by providers at [facility] o Accepting Medicaid - but does not have vaccinations in office (must be received elsewhere) o Limited availability, please call early in hospitalization  [place] ([address]) . [facility]  o Wyvonne Lenz, MD o [address], [place] [address] o [phone]  Fax [phone]      Fetal Movement Counts Patient Name: ________________________________________________ Patient Due Date: ____________________ What is a fetal movement count?  A fetal movement count is the number of times that you feel your baby move during a certain amount of time. This may also be called a fetal kick count. A fetal movement count is recommended for every pregnant woman. You may be asked to start counting fetal movements as early as week 28 of your pregnancy. Pay attention to when your baby is most active. You may notice your baby's sleep and wake cycles. You may also notice things that make your baby move more. You should do a fetal movement count:  When your baby is normally most active.  At the same time each day. A good time to count movements is while you are resting, after having something to eat and drink. How do I count fetal movements? 1. Find a quiet, comfortable area. Sit, or lie  down on your side. 2. Write down the date, the start time and stop time, and the number of movements that you felt between those two times. Take this information with you to your health care visits. 3. For 2 hours, count kicks, flutters, swishes, rolls, and jabs. You should feel at least 10 movements during 2 hours. 4. You may stop counting after you have felt 10 movements. 5. If you do not feel 10 movements in 2 hours, have something to eat and drink. Then, keep resting and counting for 1 hour. If you feel at least 4 movements during that hour, you may stop counting. Contact a health care provider if:   You feel fewer than 4 movements in 2 hours.  Your baby is not moving like he or she usually does. Date: ____________ Start time: ____________ Stop time: ____________ Movements: ____________ Date: ____________ Start time: ____________ Stop time: ____________ Movements: ____________ Date: ____________ Start time: ____________ Stop time: ____________ Movements: ____________ Date: ____________ Start time: ____________ Stop time: ____________ Movements: ____________ Date: ____________ Start time: ____________ Stop time: ____________ Movements: ____________ Date: ____________ Start time: ____________ Stop time: ____________ Movements: ____________ Date: ____________ Start time: ____________ Stop time: ____________ Movements: ____________ Date: ____________ Start time: ____________ Stop time: ____________ Movements: ____________ Date: ____________ Start time: ____________ Stop time: ____________ Movements: ____________ This information is not intended to replace advice given to you by your health care provider. Make sure you discuss any questions you have with your health care provider. Document Released: 03/27/2006 Document Revised: 03/17/2018 Document Reviewed: 04/06/2015 Elsevier Patient Education  2020 [employer] Signs and Symptoms of Labor Labor is your body's natural process of moving your baby, placenta, and umbilical cord out of your uterus. The process of labor usually starts when your baby is full-term, between 1337 and 40 weeks of pregnancy. How will I know when I am close to going into labor? As your body prepares for labor and the birth of your baby, you may notice the following symptoms in the weeks and days before true labor starts:  Having a strong desire to get your home ready to receive your new baby. This is called nesting. Nesting may be a sign that labor is approaching, and it may occur several weeks before birth. Nesting may involve cleaning and organizing your home.   Passing a small amount of thick, bloody mucus out of your vagina (normal bloody show or losing your mucus plug). This may happen more than a week before labor begins, or it might occur right before labor begins as the opening of the cervix starts to widen (dilate). For some women, the entire mucus plug passes at once. For others, smaller portions of the mucus plug may gradually pass over several days.  Your baby moving (dropping) lower in your pelvis to get into position for birth (lightening). When this happens, you may feel more pressure on your bladder and pelvic bone and less pressure on your ribs. This may make it easier to breathe. It may also cause you to need to urinate more often and have problems with bowel movements.  Having "practice contractions" (Braxton Hicks contractions) that occur at irregular (unevenly spaced) intervals that are more than 10 minutes apart. This is also called false labor. False labor contractions are common after exercise or sexual activity, and they will stop if you change position, rest, or drink fluids. These contractions are usually mild and do not get stronger over time. They may feel like: ? A backache or back  pain. ? Mild cramps, similar to menstrual cramps. ? Tightening or pressure in your abdomen. Other early symptoms that labor may be starting soon include:  Nausea or loss of appetite.  Diarrhea.  Having a sudden burst of energy, or feeling very tired.  Mood changes.  Having trouble sleeping. How will I know when labor has begun? Signs that true labor has begun may include:  Having contractions that come at regular (evenly spaced) intervals and increase in intensity. This may feel like more intense tightening or pressure in your abdomen that moves to your back. ? Contractions may also feel like rhythmic pain in your upper thighs or back that comes and goes at regular intervals. ? For first-time mothers, this change in intensity of contractions  often occurs at a more gradual pace. ? Women who have given birth before may notice a more rapid progression of contraction changes.  Having a feeling of pressure in the vaginal area.  Your water breaking (rupture of membranes). This is when the sac of fluid that surrounds your baby breaks. When this happens, you will notice fluid leaking from your vagina. This may be clear or blood-tinged. Labor usually starts within 24 hours of your water breaking, but it may take longer to begin. ? Some women notice this as a gush of fluid. ? Others notice that their underwear repeatedly becomes damp. Follow these instructions at home:   When labor starts, or if your water breaks, call your health care provider or nurse care line. Based on your situation, they will determine when you should go in for an exam.  When you are in early labor, you may be able to rest and manage symptoms at home. Some strategies to try at home include: ? Breathing and relaxation techniques. ? Taking a warm bath or shower. ? Listening to music. ? Using a heating pad on the lower back for pain. If you are directed to use heat:  Place a towel between your skin and the heat source.  Leave the heat on for 20-30 minutes.  Remove the heat if your skin turns bright red. This is especially important if you are unable to feel pain, heat, or cold. You may have a greater risk of getting burned. Get help right away if:  You have painful, regular contractions that are 5 minutes apart or less.  Labor starts before you are [redacted] weeks along in your pregnancy.  You have a fever.  You have a headache that does not go away.  You have bright red blood coming from your vagina.  You do not feel your baby moving.  You have a sudden onset of: ? Severe headache with vision problems. ? Nausea, vomiting, or diarrhea. ? Chest pain or shortness of breath. These symptoms may be an emergency. If your health care provider recommends that you go  to the hospital or birth center where you plan to deliver, do not drive yourself. Have someone else drive you, or call emergency services (911 in the [place] Summary  Labor is your body's natural process of moving your baby, placenta, and umbilical cord out of your uterus.  The process of labor usually starts when your baby is full-term, between 24 and 40 weeks of pregnancy.  When labor starts, or if your water breaks, call your health care provider or nurse care line. Based on your situation, they will determine when you should go in for an exam. This information is not intended to replace advice given to you  by your health care provider. Make sure you discuss any questions you have with your health care provider. Document Released: 08/02/2016 Document Revised: 11/25/2016 Document Reviewed: 08/02/2016 Elsevier Patient Education  2020 [employer].

## 2019-01-12 LAB — CERVICOVAGINAL ANCILLARY ONLY
Chlamydia: NEGATIVE
Comment: NEGATIVE
Comment: NORMAL
Neisseria Gonorrhea: NEGATIVE

## 2019-01-15 LAB — CULTURE, BETA STREP (GROUP B ONLY): Strep Gp B Culture: NEGATIVE

## 2019-01-19 ENCOUNTER — Other Ambulatory Visit: Payer: Self-pay

## 2019-01-19 ENCOUNTER — Telehealth (INDEPENDENT_AMBULATORY_CARE_PROVIDER_SITE_OTHER): Payer: Medicaid Other | Admitting: Student

## 2019-01-19 DIAGNOSIS — Z3A37 37 weeks gestation of pregnancy: Secondary | ICD-10-CM

## 2019-01-19 DIAGNOSIS — O358XX0 Maternal care for other (suspected) fetal abnormality and damage, not applicable or unspecified: Secondary | ICD-10-CM

## 2019-01-19 DIAGNOSIS — Z789 Other specified health status: Secondary | ICD-10-CM

## 2019-01-19 DIAGNOSIS — Z3493 Encounter for supervision of normal pregnancy, unspecified, third trimester: Secondary | ICD-10-CM

## 2019-01-19 NOTE — Progress Notes (Signed)
   TELEHEALTH VIRTUAL OBSTETRICS VISIT ENCOUNTER NOTE  I connected with Laura Schmidt on 01/19/19 at  9:35 AM EST by telephone at home and verified that I am speaking with the correct person using two identifiers.   I discussed the limitations, risks, security and privacy concerns of performing an evaluation and management service by telephone and the availability of in person appointments. I also discussed with the patient that there may be a patient responsible charge related to this service. The patient expressed understanding and agreed to proceed.  Subjective:  Laura Schmidt is a 23 y.o. G1P0 at [redacted]w[redacted]d being followed for ongoing prenatal care.  She is currently monitored for the following issues for this low-risk pregnancy and has Supervision of low-risk pregnancy; Language barrier; and Umbilical vein abnormality affecting pregnancy on their problem list.  Patient reports small cramping. occasional vaginal discharge but no LOF. Marland Kitchen Reports fetal movement. Denies any contractions, bleeding or leaking of fluid.   The following portions of the patient's history were reviewed and updated as appropriate: allergies, current medications, past family history, past medical history, past social history, past surgical history and problem list.   Objective:   General:  Alert, oriented and cooperative.   Mental Status: Normal mood and affect perceived. Normal judgment and thought content.  Rest of physical exam deferred due to type of encounter  Assessment and Plan:  Pregnancy: G1P0 at [redacted]w[redacted]d 1. Umbilical vein abnormality affecting pregnancy, single or unspecified fetus -per MFM note on on 10-20, no more FUP needed.   2. Encounter for supervision of low-risk pregnancy in third trimester Does not want any birth control today  BP normal today for RN over the phone.  3. Language barrier Arabic interpreter used.   Term labor symptoms and general obstetric precautions including but not  limited to vaginal bleeding, contractions, leaking of fluid and fetal movement were reviewed in detail with the patient.  I discussed the assessment and treatment plan with the patient. The patient was provided an opportunity to ask questions and all were answered. The patient agreed with the plan and demonstrated an understanding of the instructions. The patient was advised to call back or seek an in-person office evaluation/go to MAU at [facility] for any urgent or concerning symptoms. Please refer to After Visit Summary for other counseling recommendations.   I provided minutes of non-face-to-face time during this encounter.  Return in about 1 week (around 01/26/2019).  Future Appointments  Date Time Provider [place]  01/26/2019  4:15 PM [facility] [place]  02/02/2019 10:35 AM Dione Plover, Annice Needy, MD [place], [place] for [place]     -telephone visit -little discharge , little cramps -birth control: nothing right now -

## 2019-01-19 NOTE — Progress Notes (Signed)
I connected with  Laura Schmidt with [facility] interpreter Julien Nordmann [phone] on 01/19/19 at  9:35 AM EST by telephone and verified that I am speaking with the correct person using two identifiers.   I discussed the limitations, risks, security and privacy concerns of performing an evaluation and management service by telephone and the availability of in person appointments. I also discussed with the patient that there may be a patient responsible charge related to this service. The patient expressed understanding and agreed to proceed.  Interpreter call dropped while transferring to provider. [name] interpreter [phone] used for provider visit.  Annabell Howells, RN 01/19/2019  9:44 AM

## 2019-01-24 ENCOUNTER — Inpatient Hospital Stay (HOSPITAL_COMMUNITY): Payer: Medicaid Other

## 2019-01-24 ENCOUNTER — Inpatient Hospital Stay (HOSPITAL_COMMUNITY)
Admission: AD | Admit: 2019-01-24 | Discharge: 2019-01-24 | Disposition: A | Discharge status: Home / Self Care | Payer: Medicaid Other | Attending: Obstetrics and Gynecology | Admitting: Obstetrics and Gynecology

## 2019-01-24 ENCOUNTER — Other Ambulatory Visit: Payer: Self-pay

## 2019-01-24 ENCOUNTER — Encounter (HOSPITAL_COMMUNITY): Payer: Self-pay

## 2019-01-24 DIAGNOSIS — O219 Vomiting of pregnancy, unspecified: Secondary | ICD-10-CM | POA: Diagnosis not present

## 2019-01-24 DIAGNOSIS — R112 Nausea with vomiting, unspecified: Secondary | ICD-10-CM | POA: Diagnosis not present

## 2019-01-24 DIAGNOSIS — R1010 Upper abdominal pain, unspecified: Secondary | ICD-10-CM | POA: Insufficient documentation

## 2019-01-24 DIAGNOSIS — O26893 Other specified pregnancy related conditions, third trimester: Secondary | ICD-10-CM | POA: Diagnosis not present

## 2019-01-24 DIAGNOSIS — R1011 Right upper quadrant pain: Secondary | ICD-10-CM | POA: Diagnosis not present

## 2019-01-24 DIAGNOSIS — Z3A37 37 weeks gestation of pregnancy: Secondary | ICD-10-CM | POA: Insufficient documentation

## 2019-01-24 DIAGNOSIS — K529 Noninfective gastroenteritis and colitis, unspecified: Secondary | ICD-10-CM | POA: Diagnosis not present

## 2019-01-24 DIAGNOSIS — O218 Other vomiting complicating pregnancy: Secondary | ICD-10-CM | POA: Diagnosis not present

## 2019-01-24 DIAGNOSIS — O479 False labor, unspecified: Secondary | ICD-10-CM

## 2019-01-24 DIAGNOSIS — R1110 Vomiting, unspecified: Secondary | ICD-10-CM

## 2019-01-24 LAB — LIPASE, BLOOD: Lipase: 30 U/L (ref 11–51)

## 2019-01-24 LAB — URINALYSIS, ROUTINE W REFLEX MICROSCOPIC
Bilirubin Urine: NEGATIVE
Glucose, UA: NEGATIVE mg/dL
Ketones, ur: NEGATIVE mg/dL
Leukocytes,Ua: NEGATIVE
Nitrite: NEGATIVE
Protein, ur: 30 mg/dL — AB
Specific Gravity, Urine: 1.008 (ref 1.005–1.030)
pH: 8 (ref 5.0–8.0)

## 2019-01-24 LAB — CBC WITH DIFFERENTIAL/PLATELET
Abs Immature Granulocytes: 0.11 10*3/uL — ABNORMAL HIGH (ref 0.00–0.07)
Basophils Absolute: 0.1 10*3/uL (ref 0.0–0.1)
Basophils Relative: 0 %
Eosinophils Absolute: 0 10*3/uL (ref 0.0–0.5)
Eosinophils Relative: 0 %
HCT: 32.9 % — ABNORMAL LOW (ref 36.0–46.0)
Hemoglobin: 11.6 g/dL — ABNORMAL LOW (ref 12.0–15.0)
Immature Granulocytes: 1 %
Lymphocytes Relative: 14 %
Lymphs Abs: 2 10*3/uL (ref 0.7–4.0)
MCH: 30.1 pg (ref 26.0–34.0)
MCHC: 35.3 g/dL (ref 30.0–36.0)
MCV: 85.2 fL (ref 80.0–100.0)
Monocytes Absolute: 1.1 10*3/uL — ABNORMAL HIGH (ref 0.1–1.0)
Monocytes Relative: 8 %
Neutro Abs: 11.1 10*3/uL — ABNORMAL HIGH (ref 1.7–7.7)
Neutrophils Relative %: 77 %
Platelets: 257 10*3/uL (ref 150–400)
RBC: 3.86 MIL/uL — ABNORMAL LOW (ref 3.87–5.11)
RDW: 14.8 % (ref 11.5–15.5)
WBC: 14.4 10*3/uL — ABNORMAL HIGH (ref 4.0–10.5)
nRBC: 0 % (ref 0.0–0.2)

## 2019-01-24 LAB — COMPREHENSIVE METABOLIC PANEL
ALT: 11 U/L (ref 0–44)
AST: 18 U/L (ref 15–41)
Albumin: 2.7 g/dL — ABNORMAL LOW (ref 3.5–5.0)
Alkaline Phosphatase: 80 U/L (ref 38–126)
Anion gap: 13 (ref 5–15)
BUN: 7 mg/dL (ref 6–20)
CO2: 21 mmol/L — ABNORMAL LOW (ref 22–32)
Calcium: 8.5 mg/dL — ABNORMAL LOW (ref 8.9–10.3)
Chloride: 103 mmol/L (ref 98–111)
Creatinine, Ser: 0.44 mg/dL (ref 0.44–1.00)
GFR calc Af Amer: >60 mL/min (ref >60)
GFR calc non Af Amer: >60 mL/min (ref >60)
Glucose, Bld: 92 mg/dL (ref 70–99)
Potassium: 3.5 mmol/L (ref 3.5–5.1)
Sodium: 137 mmol/L (ref 135–145)
Total Bilirubin: 0.5 mg/dL (ref 0.3–1.2)
Total Protein: 5.9 g/dL — ABNORMAL LOW (ref 6.5–8.1)

## 2019-01-24 MED ORDER — ACETAMINOPHEN 500 MG PO TABS
1000.0000 mg | ORAL_TABLET | Freq: Once | ORAL | Status: AC
  Administered 2019-01-24: 1000 mg via ORAL
  Filled 2019-01-24: qty 2

## 2019-01-24 MED ORDER — ONDANSETRON 4 MG PO TBDP
8.0000 mg | ORAL_TABLET | Freq: Once | ORAL | Status: AC
  Administered 2019-01-24: 07:00:00 8 mg via ORAL
  Filled 2019-01-24: qty 2

## 2019-01-24 MED ORDER — ONDANSETRON 4 MG PO TBDP: 4.0000 mg | ORAL_TABLET | Freq: Four times a day (QID) | ORAL | 0 refills | Status: DC | PRN

## 2019-01-24 MED ORDER — DICYCLOMINE HCL 10 MG/5ML PO SOLN
10.0000 mg | Freq: Once | ORAL | Status: AC
  Administered 2019-01-24: 07:00:00 10 mg via ORAL
  Filled 2019-01-24: qty 5

## 2019-01-24 NOTE — Discharge Instructions (Signed)
Reasons to return to MAU at [facility]:  1.  Contractions are  5 minutes apart or less, each last 1 minute, these have been going on for 1-2 hours, and you cannot walk or talk during them 2.  You have a large gush of fluid, or a trickle of fluid that will not stop and you have to wear a pad 3.  You have bleeding that is bright red, heavier than spotting--like menstrual bleeding (spotting can be normal in early labor or after a check of your cervix) 4.  You do not feel the baby moving like he/she normally does  

## 2019-01-24 NOTE — MAU Note (Addendum)
Presents with vomiting blood x 1 episode approx 0445 min ago, followed by SOB.Epigastic pain 8/10. Denies chest pain. SOB resolved after brought back to MAU room. No FM since vomiting epidsode.    Gilmer Mor RN

## 2019-01-24 NOTE — MAU Provider Note (Addendum)
History     CSN: [id number]  Arrival date and time: 01/24/19 0459   None     Chief Complaint  Patient presents with  . Vomiting    blood   HPI   [name] is a 23 y.o. female G1P0 @ [redacted]w[redacted]d here in MAU after one episode of vomiting. States she had a large amount of vomit around 0300 and then noted some specks of blood in the vomit. She feels pain at the top of her abdomen at this time. At midnight she ate 1 boiled egg. She had dinner around 8 pm which was meat, veggies and rice. + fetal movement. Feels some contractions.   OB History    Gravida  1   Para      Term      Preterm      AB      Living        SAB      TAB      Ectopic      Multiple      Live Births              Past Medical History:  Diagnosis Date  . Anemia     Past Surgical History:  Procedure Laterality Date  . NO PAST SURGERIES      Family History  Problem Relation Age of Onset  . Diabetes Mother     Social History   Tobacco Use  . Smoking status: Never Smoker  . Smokeless tobacco: Never Used  Substance Use Topics  . Alcohol use: Never    Frequency: Never  . Drug use: Never    Allergies: No Known Allergies  Medications Prior to Admission  Medication Sig Dispense Refill Last Dose  . AMBULATORY NON FORMULARY MEDICATION 1 Device by Other route once a week. Medication Name: Blood Pressure Cuff Monitored regularly at home  ICD 10 Z34.90     . Prenatal Vit-Fe Fumarate-FA (PRENATAL VITAMINS PO) Take 1 tablet by mouth daily.      Results for orders placed or performed during the hospital encounter of 01/24/19 (from the past 48 hour(s))  Urinalysis, Routine w reflex microscopic     Status: Abnormal   Collection Time: 01/24/19  6:23 AM  Result Value Ref Range   Color, Urine YELLOW YELLOW   APPearance HAZY (A) CLEAR   Specific Gravity, Urine 1.008 1.005 - 1.030   pH 8.0 5.0 - 8.0   Glucose, UA NEGATIVE NEGATIVE mg/dL   Hgb urine dipstick MODERATE (A) NEGATIVE    Bilirubin Urine NEGATIVE NEGATIVE   Ketones, ur NEGATIVE NEGATIVE mg/dL   Protein, ur 30 (A) NEGATIVE mg/dL   Nitrite NEGATIVE NEGATIVE   Leukocytes,Ua NEGATIVE NEGATIVE   RBC / HPF 0-5 0 - 5 RBC/hpf   WBC, UA 0-5 0 - 5 WBC/hpf   Bacteria, UA MANY (A) NONE SEEN   Squamous Epithelial / LPF 0-5 0 - 5    Comment: Performed at [facility], [address] [place], [place] [address]  CBC with Differential/Platelet     Status: Abnormal   Collection Time: 01/24/19  6:48 AM  Result Value Ref Range   WBC 14.4 (H) 4.0 - 10.5 K/uL   RBC 3.86 (L) 3.87 - 5.11 MIL/uL   Hemoglobin 11.6 (L) 12.0 - 15.0 g/dL   HCT 32.9 (L) 36.0 - 46.0 %   MCV 85.2 80.0 - 100.0 fL   MCH 30.1 26.0 - 34.0 pg   MCHC 35.3 30.0 -  36.0 g/dL   RDW 03.5 00.9 - 38.1 %   Platelets 257 150 - 400 K/uL   nRBC 0.0 0.0 - 0.2 %   Neutrophils Relative % 77 %   Neutro Abs 11.1 (H) 1.7 - 7.7 K/uL   Lymphocytes Relative 14 %   Lymphs Abs 2.0 0.7 - 4.0 K/uL   Monocytes Relative 8 %   Monocytes Absolute 1.1 (H) 0.1 - 1.0 K/uL   Eosinophils Relative 0 %   Eosinophils Absolute 0.0 0.0 - 0.5 K/uL   Basophils Relative 0 %   Basophils Absolute 0.1 0.0 - 0.1 K/uL   Immature Granulocytes 1 %   Abs Immature Granulocytes 0.11 (H) 0.00 - 0.07 K/uL    Comment: Performed at [facility], [address], [place], [place] [address]  Lipase, blood     Status: None   Collection Time: 01/24/19  6:48 AM  Result Value Ref Range   Lipase 30 11 - 51 U/L    Comment: Performed at [facility], [address], [place], [place] [address]  Comprehensive metabolic panel     Status: Abnormal   Collection Time: 01/24/19  6:48 AM  Result Value Ref Range   Sodium 137 135 - 145 mmol/L   Potassium 3.5 3.5 - 5.1 mmol/L   Chloride 103 98 - 111 mmol/L   CO2 21 (L) 22 - 32 mmol/L   Glucose, Bld 92 70 - 99 mg/dL   BUN 7 6 - 20 mg/dL   Creatinine, Ser 7.89 0.44 - 1.00 mg/dL   Calcium 8.5 (L) 8.9 - 10.3 mg/dL   Total Protein 5.9 (L) 6.5 -  8.1 g/dL   Albumin 2.7 (L) 3.5 - 5.0 g/dL   AST 18 15 - 41 U/L   ALT 11 0 - 44 U/L   Alkaline Phosphatase 80 38 - 126 U/L   Total Bilirubin 0.5 0.3 - 1.2 mg/dL   GFR calc non Af Amer >60 >60 mL/min   GFR calc Af Amer >60 >60 mL/min   Anion gap 13 5 - 15    Comment: Performed at [facility], [address], [place], [place] [address]   US Abdomen Limited Ruq  Result Date: 01/24/2019 CLINICAL DATA:  Right upper quadrant abdominal pain, nausea and vomiting. Thirty-seven weeks pregnant. EXAM: ULTRASOUND ABDOMEN LIMITED RIGHT UPPER QUADRANT COMPARISON:  None. FINDINGS: Gallbladder: No gallstones or wall thickening visualized. No sonographic Murphy sign noted by sonographer. Common bile duct: Diameter: 4.7 mm Liver: No focal lesion identified. Within normal limits in parenchymal echogenicity. Portal vein is patent on color Doppler imaging with normal direction of blood flow towards the liver. Other: None. IMPRESSION: Normal examination. Electronically Signed   By: Beckie Salts M.D.   On: 01/24/2019 08:18    Review of Systems  Constitutional: Negative for fever.  Gastrointestinal: Positive for abdominal pain (Contraction pain ), nausea and vomiting.   Physical Exam   Blood pressure 114/60, pulse 96, temperature 98 F (36.7 C), temperature source Oral, resp. rate 16, height 5\' 1"  (1.549 m), weight 71.7 kg, last menstrual period 05/05/2018, SpO2 97 %.  Physical Exam  Constitutional: She is oriented to person, place, and time. She appears well-developed and well-nourished. No distress.  HENT:  Head: Normocephalic.  GI: Soft. Normal appearance. There is abdominal tenderness in the epigastric area. There is no rigidity, no rebound, no guarding and negative Murphy's sign.    Genitourinary:    Genitourinary Comments: Dilation: Closed Effacement (%): Thick  Cervical Position: Posterior Presentation: Undeterminable Exam by:: Adah Perl RN   Musculoskeletal: Normal range of motion.   Neurological: She is alert and oriented to person, place, and time.  Skin: Skin is warm. She is not diaphoretic.  Psychiatric: Her behavior is normal.   Fetal Tracing: Baseline: 135 bpm Variability: Moderate  Accelerations: 15x15 Decelerations: variable  Toco: Occasional   MAU Course  Procedures  None  MDM Zofran, and GI cocktail RUQ [place] CBC, CMP, Urine, urine culture pending  Lipase normal  Report given to Sharen Counter CNM who resumes care of the patient.   Rasch, Harolyn Rutherford, NP  Assessment and Plan   US Abdomen Limited Ruq  Result Date: 01/24/2019 CLINICAL DATA:  Right upper quadrant abdominal pain, nausea and vomiting. Thirty-seven weeks pregnant. EXAM: ULTRASOUND ABDOMEN LIMITED RIGHT UPPER QUADRANT COMPARISON:  None. FINDINGS: Gallbladder: No gallstones or wall thickening visualized. No sonographic Murphy sign noted by sonographer. Common bile duct: Diameter: 4.7 mm Liver: No focal lesion identified. Within normal limits in parenchymal echogenicity. Portal vein is patent on color Doppler imaging with normal direction of blood flow towards the liver. Other: None. IMPRESSION: Normal examination. Electronically Signed   By: Beckie Salts M.D.   On: 01/24/2019 08:18    Results for orders placed or performed during the hospital encounter of 01/24/19 (from the past 24 hour(s))  Urinalysis, Routine w reflex microscopic     Status: Abnormal   Collection Time: 01/24/19  6:23 AM  Result Value Ref Range   Color, Urine YELLOW YELLOW   APPearance HAZY (A) CLEAR   Specific Gravity, Urine 1.008 1.005 - 1.030   pH 8.0 5.0 - 8.0   Glucose, UA NEGATIVE NEGATIVE mg/dL   Hgb urine dipstick MODERATE (A) NEGATIVE   Bilirubin Urine NEGATIVE NEGATIVE   Ketones, ur NEGATIVE NEGATIVE mg/dL   Protein, ur 30 (A) NEGATIVE mg/dL   Nitrite NEGATIVE NEGATIVE   Leukocytes,Ua NEGATIVE NEGATIVE   RBC / HPF 0-5 0 - 5 RBC/hpf   WBC, UA 0-5 0 - 5 WBC/hpf   Bacteria, UA MANY (A) NONE SEEN    Squamous Epithelial / LPF 0-5 0 - 5  CBC with Differential/Platelet     Status: Abnormal   Collection Time: 01/24/19  6:48 AM  Result Value Ref Range   WBC 14.4 (H) 4.0 - 10.5 K/uL   RBC 3.86 (L) 3.87 - 5.11 MIL/uL   Hemoglobin 11.6 (L) 12.0 - 15.0 g/dL   HCT 40.9 (L) 81.1 - 91.4 %   MCV 85.2 80.0 - 100.0 fL   MCH 30.1 26.0 - 34.0 pg   MCHC 35.3 30.0 - 36.0 g/dL   RDW 78.2 95.6 - 21.3 %   Platelets 257 150 - 400 K/uL   nRBC 0.0 0.0 - 0.2 %   Neutrophils Relative % 77 %   Neutro Abs 11.1 (H) 1.7 - 7.7 K/uL   Lymphocytes Relative 14 %   Lymphs Abs 2.0 0.7 - 4.0 K/uL   Monocytes Relative 8 %   Monocytes Absolute 1.1 (H) 0.1 - 1.0 K/uL   Eosinophils Relative 0 %   Eosinophils Absolute 0.0 0.0 - 0.5 K/uL   Basophils Relative 0 %   Basophils Absolute 0.1 0.0 - 0.1 K/uL   Immature Granulocytes 1 %   Abs Immature Granulocytes 0.11 (H) 0.00 - 0.07 K/uL  Lipase, blood     Status: None   Collection Time: 01/24/19  6:48 AM  Result Value Ref Range   Lipase 30 11 - 51  U/L  Comprehensive metabolic panel     Status: Abnormal   Collection Time: 01/24/19  6:48 AM  Result Value Ref Range   Sodium 137 135 - 145 mmol/L   Potassium 3.5 3.5 - 5.1 mmol/L   Chloride 103 98 - 111 mmol/L   CO2 21 (L) 22 - 32 mmol/L   Glucose, Bld 92 70 - 99 mg/dL   BUN 7 6 - 20 mg/dL   Creatinine, Ser 7.820.44 0.44 - 1.00 mg/dL   Calcium 8.5 (L) 8.9 - 10.3 mg/dL   Total Protein 5.9 (L) 6.5 - 8.1 g/dL   Albumin 2.7 (L) 3.5 - 5.0 g/dL   AST 18 15 - 41 U/L   ALT 11 0 - 44 U/L   Alkaline Phosphatase 80 38 - 126 U/L   Total Bilirubin 0.5 0.3 - 1.2 mg/dL   GFR calc non Af Amer >60 >60 mL/min   GFR calc Af Amer >60 >60 mL/min   Anion gap 13 5 - 15    FHR baseline 150 with moderate variability, positive accelerations, no decelerations No contractions on toco or to palpation  MDM:  No acute abdomen, pt symptoms resolved with medications in MAU.  RUQ US wnl.  Likely gastroenteritis.  Cervix unchanged in MAU, no  evidence of labor, NST reactive.  D/C home with labor precautions, Rx for Zofran sent to pharmacy.  A: 1. Nausea/vomiting in pregnancy   2. Vomiting   3. Upper abdominal pain   4. Gastroenteritis   5. Braxton Hicks contractions     P: D/C home  Sharen CounterLisa Leftwich-Kirby, CNM 12:53 PM

## 2019-01-25 LAB — CULTURE, OB URINE
Culture: NO GROWTH
Special Requests: NORMAL

## 2019-01-26 ENCOUNTER — Telehealth (INDEPENDENT_AMBULATORY_CARE_PROVIDER_SITE_OTHER): Payer: Medicaid Other | Admitting: Student

## 2019-01-26 ENCOUNTER — Other Ambulatory Visit: Payer: Self-pay

## 2019-01-26 DIAGNOSIS — O358XX0 Maternal care for other (suspected) fetal abnormality and damage, not applicable or unspecified: Secondary | ICD-10-CM | POA: Diagnosis not present

## 2019-01-26 DIAGNOSIS — Z3A38 38 weeks gestation of pregnancy: Secondary | ICD-10-CM | POA: Diagnosis not present

## 2019-01-26 DIAGNOSIS — Z3493 Encounter for supervision of normal pregnancy, unspecified, third trimester: Secondary | ICD-10-CM

## 2019-01-26 DIAGNOSIS — Z789 Other specified health status: Secondary | ICD-10-CM

## 2019-01-26 NOTE — Progress Notes (Addendum)
Patient ID: Laura Schmidt, female   DOB: [birth year], 23 y.o.   MRN: [id number] .cwh   TELEHEALTH VIRTUAL OBSTETRICS VISIT ENCOUNTER NOTE  I connected with Laura Schmidt on 01/26/19 at  4:15 PM EST by telephone at home and verified that I am speaking with the correct person using two identifiers.   I discussed the limitations, risks, security and privacy concerns of performing an evaluation and management service by telephone and the availability of in person appointments. I also discussed with the patient that there may be a patient responsible charge related to this service. The patient expressed understanding and agreed to proceed.  Subjective:  Laura Schmidt is a 23 y.o. G1P0 at [redacted]w[redacted]d being followed for ongoing prenatal care.  She is currently monitored for the following issues for this low-risk pregnancy and has Supervision of low-risk pregnancy; Language barrier; and Umbilical vein abnormality affecting pregnancy on their problem list.  Patient reports irregular contractions that are an hour apart. .  She has no questions today. Reports fetal movement. Denies any contractions, bleeding or leaking of fluid.   The following portions of the patient's history were reviewed and updated as appropriate: allergies, current medications, past family history, past medical history, past social history, past surgical history and problem list.   Objective:   General:  Alert, oriented and cooperative.   Mental Status: Normal mood and affect perceived. Normal judgment and thought content.  Rest of physical exam deferred due to type of encounter  Assessment and Plan:  Pregnancy: G1P0 at [redacted]w[redacted]d 1. Umbilical vein abnormality affecting pregnancy, single or unspecified fetus -no further work-up necessary per MFM report on 12-29-2018  2. Encounter for supervision of low-risk pregnancy in third trimester -reviewed warning signs and when to go to MAU.  -reviewed that we will induce her  at 41 weeks if she does not go into labor -BP normal today; 121/75 3. Language barrier   Term labor symptoms and general obstetric precautions including but not limited to vaginal bleeding, contractions, leaking of fluid and fetal movement were reviewed in detail with the patient.  I discussed the assessment and treatment plan with the patient. The patient was provided an opportunity to ask questions and all were answered. The patient agreed with the plan and demonstrated an understanding of the instructions. The patient was advised to call back or seek an in-person office evaluation/go to MAU at [facility] for any urgent or concerning symptoms. Please refer to After Visit Summary for other counseling recommendations.   I provided 11 minutes of non-face-to-face time during this encounter.  No follow-ups on file.  Future Appointments  Date Time Provider [place]  02/02/2019 10:35 AM Dione Plover, Annice Needy, MD [place], [place] for [place]

## 2019-01-26 NOTE — Progress Notes (Signed)
I connected with  Laura Schmidt on 01/26/19 at  4:15 PM EST by telephone and verified that I am speaking with the correct person using two identifiers.   I discussed the limitations, risks, security and privacy concerns of performing an evaluation and management service by telephone and the availability of in person appointments. I also discussed with the patient that there may be a patient responsible charge related to this service. The patient expressed understanding and agreed to proceed.  Bethanne Ginger, CMA 01/26/2019  4:12 PM

## 2019-01-27 NOTE — Addendum Note (Signed)
Addended by: Carolynne Edouard on: 01/27/2019 11:21 AM   Modules accepted: Level of Service

## 2019-02-02 ENCOUNTER — Telehealth (INDEPENDENT_AMBULATORY_CARE_PROVIDER_SITE_OTHER): Payer: Medicaid Other | Admitting: Family Medicine

## 2019-02-02 ENCOUNTER — Other Ambulatory Visit: Payer: Self-pay

## 2019-02-02 DIAGNOSIS — Z789 Other specified health status: Secondary | ICD-10-CM

## 2019-02-02 DIAGNOSIS — O358XX0 Maternal care for other (suspected) fetal abnormality and damage, not applicable or unspecified: Secondary | ICD-10-CM

## 2019-02-02 DIAGNOSIS — Z3A39 39 weeks gestation of pregnancy: Secondary | ICD-10-CM

## 2019-02-02 DIAGNOSIS — Z3493 Encounter for supervision of normal pregnancy, unspecified, third trimester: Secondary | ICD-10-CM

## 2019-02-02 NOTE — Progress Notes (Signed)
I connected with  Laura Schmidt on 02/02/19 at 10:35 AM EST by telephone and verified that I am speaking with the correct person using two identifiers.   I discussed the limitations, risks, security and privacy concerns of performing an evaluation and management service by telephone and the availability of in person appointments. I also discussed with the patient that there may be a patient responsible charge related to this service. The patient expressed understanding and agreed to proceed.  [name], Miles 02/02/2019  10:51 AM

## 2019-02-02 NOTE — Progress Notes (Signed)
I connected with@ on 02/02/19 at 10:35 AM EST by: Phone and verified that I am speaking with the correct person using two identifiers.  Patient is located at home and provider is located at [place] office.     The purpose of this virtual visit is to provide medical care while limiting exposure to the novel coronavirus. I discussed the limitations, risks, security and privacy concerns of performing an evaluation and management service by phone and the availability of in person appointments. I also discussed with the patient that there may be a patient responsible charge related to this service. By engaging in this virtual visit, you consent to the provision of healthcare.  Additionally, you authorize for your insurance to be billed for the services provided during this visit.  The patient expressed understanding and agreed to proceed.  The following staff members participated in the virtual visit:  Dr. Dione Plover, Interpreter [place]    PRENATAL VISIT NOTE  Subjective:  Laura Schmidt is a 23 y.o. G1P0 at [redacted]w[redacted]d  for phone visit for ongoing prenatal care.  She is currently monitored for the following issues for this low-risk pregnancy and has Supervision of low-risk pregnancy; Language barrier; and Umbilical vein abnormality affecting pregnancy on their problem list.  Patient reports no bleeding, no contractions, no leaking and occasional mild pelvic pain.  Contractions: Irritability. Vag. Bleeding: None.  Movement: Present. Denies leaking of fluid.   The following portions of the patient's history were reviewed and updated as appropriate: allergies, current medications, past family history, past medical history, past social history, past surgical history and problem list.   Objective:   Vitals:   02/02/19 1051  BP: 124/71  Pulse: 82  Weight: 161 lb (73 kg)   Self-Obtained  Fetal Status:     Movement: Present     Assessment and Plan:  Pregnancy: G1P0 at [redacted]w[redacted]d 1. Umbilical vein abnormality  affecting pregnancy, single or unspecified fetus Last [place] w MFM 12/29/2018, no further f/u indicated  2. Encounter for supervision of low-risk pregnancy in third trimester Labs up to date Counseled on IOL at due date vs PD testing and IOL at 41 weeks Elects for IOL at 40 weeks, schedule request sent IOL orders placed  3. Language barrier Arabic interpreter used during encounter  Term labor symptoms and general obstetric precautions including but not limited to vaginal bleeding, contractions, leaking of fluid and fetal movement were reviewed in detail with the patient.  No follow-ups on file.  No future appointments.   Time spent on virtual visit: 15 minutes  Clarnce Flock, MD

## 2019-02-03 ENCOUNTER — Telehealth (HOSPITAL_COMMUNITY): Payer: Self-pay | Admitting: *Deleted

## 2019-02-03 ENCOUNTER — Encounter (HOSPITAL_COMMUNITY): Payer: Self-pay | Admitting: *Deleted

## 2019-02-03 NOTE — Telephone Encounter (Signed)
Preadmission screen Interpreter number [phone]

## 2019-02-04 ENCOUNTER — Other Ambulatory Visit: Payer: Self-pay | Admitting: Family Medicine

## 2019-02-06 ENCOUNTER — Other Ambulatory Visit (HOSPITAL_COMMUNITY)
Admission: RE | Admit: 2019-02-06 | Discharge: 2019-02-06 | Disposition: A | Discharge status: Home / Self Care | Payer: Medicaid Other | Source: Ambulatory Visit | Attending: Family Medicine | Admitting: Family Medicine

## 2019-02-06 DIAGNOSIS — U071 COVID-19: Secondary | ICD-10-CM | POA: Insufficient documentation

## 2019-02-06 DIAGNOSIS — Z01812 Encounter for preprocedural laboratory examination: Secondary | ICD-10-CM | POA: Diagnosis present

## 2019-02-06 LAB — SARS CORONAVIRUS 2 (TAT 6-24 HRS): SARS Coronavirus 2: POSITIVE — AB

## 2019-02-07 NOTE — Progress Notes (Signed)
Dr. Kennon Rounds made aware of pt's positive covid test result. Dr. Kennon Rounds then advised to call the on-call doctor, as she is on vacation until next week. Dr. Rip Harbour made aware of the results, as requested by Dr. Kennon Rounds.

## 2019-02-08 ENCOUNTER — Encounter: Payer: Self-pay | Admitting: Obstetrics and Gynecology

## 2019-02-08 ENCOUNTER — Other Ambulatory Visit: Payer: Self-pay | Admitting: Advanced Practice Midwife

## 2019-02-08 ENCOUNTER — Telehealth: Payer: Self-pay | Admitting: Obstetrics and Gynecology

## 2019-02-08 ENCOUNTER — Telehealth (HOSPITAL_COMMUNITY): Payer: Self-pay | Admitting: *Deleted

## 2019-02-08 ENCOUNTER — Encounter: Payer: Self-pay | Admitting: Medical

## 2019-02-08 DIAGNOSIS — O43199 Other malformation of placenta, unspecified trimester: Secondary | ICD-10-CM | POA: Insufficient documentation

## 2019-02-08 DIAGNOSIS — O98513 Other viral diseases complicating pregnancy, third trimester: Secondary | ICD-10-CM | POA: Insufficient documentation

## 2019-02-08 NOTE — Telephone Encounter (Signed)
Spoke with Dr Nehemiah Settle about Covid result positive.  He stated he will discuss with Dr Rip Harbour to determine if patient was notified.

## 2019-02-08 NOTE — Telephone Encounter (Signed)
OB Telephone Note I talked to Dr. Rip Harbour and he had talked to the patient and he states he told L&D about it.  Patient called at [phone] with interpreter. Patient is asymptomatic. She denies any sick contacts. Only OB s/s is contraction every 1-2 hours and has good FM.  Patient states she hadn't heard about the test results. D/w her that she likely has an asymptomatic + covid test. I told her that baby should be able to stay in the room with her and that likely only difference in care will be having to be in a special room and she can have one support person but that person can't come and go from the room; I told her that these may be different when she arrives and will let her know and the pediatricians will talk to her about this more after delivery.   Her husband is also asymptomatic and not working at the moment and I told him that I don't think he needs a test since he's asymptomatic and he'll be quarantining with his wife and child anyways regardless of the test result. ED precautions given.   She states she was told to come in at 0700 tomorrow, 12/1, and I told her to stay by the phone and we'll let her know if that has to change in case a special accommodations for her are full at the time. Since she has a UVV she has an indication for induction that can't be delayed.  I will touch base with L&D and see if there's anything else that we need to let them know before tomorrow.  Durene Romans MD Attending Center for [employer] (Faculty Practice) 02/08/2019 Time: [phone]

## 2019-02-09 ENCOUNTER — Inpatient Hospital Stay (HOSPITAL_COMMUNITY)
Admission: AD | Admit: 2019-02-09 | Discharge: 2019-02-11 | DRG: 768 | Disposition: A | Discharge status: Home / Self Care | Payer: Medicaid Other | Attending: Obstetrics and Gynecology | Admitting: Obstetrics and Gynecology

## 2019-02-09 ENCOUNTER — Other Ambulatory Visit: Payer: Self-pay

## 2019-02-09 ENCOUNTER — Inpatient Hospital Stay (HOSPITAL_COMMUNITY)
Admission: AD | Admit: 2019-02-09 | Payer: Medicaid Other | Source: Home / Self Care | Admitting: Obstetrics and Gynecology

## 2019-02-09 ENCOUNTER — Inpatient Hospital Stay (HOSPITAL_COMMUNITY): Payer: Medicaid Other | Admitting: Anesthesiology

## 2019-02-09 ENCOUNTER — Inpatient Hospital Stay (HOSPITAL_COMMUNITY): Payer: Medicaid Other

## 2019-02-09 ENCOUNTER — Encounter: Payer: Self-pay | Admitting: Advanced Practice Midwife

## 2019-02-09 ENCOUNTER — Encounter (HOSPITAL_COMMUNITY): Payer: Self-pay

## 2019-02-09 DIAGNOSIS — O98513 Other viral diseases complicating pregnancy, third trimester: Secondary | ICD-10-CM | POA: Diagnosis present

## 2019-02-09 DIAGNOSIS — Z3A40 40 weeks gestation of pregnancy: Secondary | ICD-10-CM | POA: Diagnosis not present

## 2019-02-09 DIAGNOSIS — Z3490 Encounter for supervision of normal pregnancy, unspecified, unspecified trimester: Secondary | ICD-10-CM

## 2019-02-09 DIAGNOSIS — U071 COVID-19: Secondary | ICD-10-CM | POA: Diagnosis present

## 2019-02-09 DIAGNOSIS — Z603 Acculturation difficulty: Secondary | ICD-10-CM | POA: Diagnosis present

## 2019-02-09 DIAGNOSIS — O26893 Other specified pregnancy related conditions, third trimester: Secondary | ICD-10-CM | POA: Diagnosis not present

## 2019-02-09 DIAGNOSIS — O358XX0 Maternal care for other (suspected) fetal abnormality and damage, not applicable or unspecified: Secondary | ICD-10-CM | POA: Diagnosis present

## 2019-02-09 DIAGNOSIS — Z8619 Personal history of other infectious and parasitic diseases: Secondary | ICD-10-CM

## 2019-02-09 DIAGNOSIS — Z789 Other specified health status: Secondary | ICD-10-CM | POA: Diagnosis present

## 2019-02-09 DIAGNOSIS — O43123 Velamentous insertion of umbilical cord, third trimester: Secondary | ICD-10-CM | POA: Diagnosis not present

## 2019-02-09 DIAGNOSIS — O9852 Other viral diseases complicating childbirth: Secondary | ICD-10-CM | POA: Diagnosis not present

## 2019-02-09 LAB — TYPE AND SCREEN
ABO/RH(D): O POS
Antibody Screen: NEGATIVE

## 2019-02-09 LAB — CBC
HCT: 36.4 % (ref 36.0–46.0)
Hemoglobin: 12.4 g/dL (ref 12.0–15.0)
MCH: 30.3 pg (ref 26.0–34.0)
MCHC: 34.1 g/dL (ref 30.0–36.0)
MCV: 89 fL (ref 80.0–100.0)
Platelets: 221 10*3/uL (ref 150–400)
RBC: 4.09 MIL/uL (ref 3.87–5.11)
RDW: 15.3 % (ref 11.5–15.5)
WBC: 11.9 10*3/uL — ABNORMAL HIGH (ref 4.0–10.5)
nRBC: 0 % (ref 0.0–0.2)

## 2019-02-09 LAB — ABO/RH: ABO/RH(D): O POS

## 2019-02-09 LAB — RPR: RPR Ser Ql: NONREACTIVE

## 2019-02-09 LAB — POCT FERN TEST: POCT Fern Test: POSITIVE

## 2019-02-09 MED ORDER — IBUPROFEN 600 MG PO TABS
600.0000 mg | ORAL_TABLET | Freq: Four times a day (QID) | ORAL | Status: DC
  Administered 2019-02-09 – 2019-02-11 (×7): 600 mg via ORAL
  Filled 2019-02-09 (×7): qty 1

## 2019-02-09 MED ORDER — PHENYLEPHRINE 40 MCG/ML (10ML) SYRINGE FOR IV PUSH (FOR BLOOD PRESSURE SUPPORT): 80.0000 ug | PREFILLED_SYRINGE | INTRAVENOUS | Status: DC | PRN

## 2019-02-09 MED ORDER — EPHEDRINE 5 MG/ML INJ: 10.0000 mg | INTRAVENOUS | Status: DC | PRN

## 2019-02-09 MED ORDER — OXYTOCIN 40 UNITS IN NORMAL SALINE INFUSION - SIMPLE MED
1.0000 m[IU]/min | INTRAVENOUS | Status: DC
  Administered 2019-02-09: 2 m[IU]/min via INTRAVENOUS

## 2019-02-09 MED ORDER — SODIUM CHLORIDE (PF) 0.9 % IJ SOLN
INTRAMUSCULAR | Status: DC | PRN
  Administered 2019-02-09: 12 mL/h via EPIDURAL

## 2019-02-09 MED ORDER — SIMETHICONE 80 MG PO CHEW: 80.0000 mg | CHEWABLE_TABLET | ORAL | Status: DC | PRN

## 2019-02-09 MED ORDER — LACTATED RINGERS IV SOLN: 500.0000 mL | Freq: Once | INTRAVENOUS | Status: DC

## 2019-02-09 MED ORDER — FENTANYL-BUPIVACAINE-NACL 0.5-0.125-0.9 MG/250ML-% EP SOLN
12.0000 mL/h | EPIDURAL | Status: DC | PRN
  Filled 2019-02-09: qty 250

## 2019-02-09 MED ORDER — MAGNESIUM HYDROXIDE 400 MG/5ML PO SUSP: 30.0000 mL | ORAL | Status: DC | PRN

## 2019-02-09 MED ORDER — DIPHENHYDRAMINE HCL 50 MG/ML IJ SOLN: 12.5000 mg | INTRAMUSCULAR | Status: DC | PRN

## 2019-02-09 MED ORDER — OXYCODONE HCL 5 MG PO TABS: 10.0000 mg | ORAL_TABLET | ORAL | Status: DC | PRN

## 2019-02-09 MED ORDER — BENZOCAINE-MENTHOL 20-0.5 % EX AERO
1.0000 "application " | INHALATION_SPRAY | CUTANEOUS | Status: DC | PRN
  Administered 2019-02-10: 1 via TOPICAL
  Filled 2019-02-09: qty 56

## 2019-02-09 MED ORDER — LIDOCAINE HCL (PF) 1 % IJ SOLN
30.0000 mL | INTRAMUSCULAR | Status: AC | PRN
  Administered 2019-02-09: 30 mL via SUBCUTANEOUS
  Filled 2019-02-09: qty 30

## 2019-02-09 MED ORDER — LACTATED RINGERS IV SOLN
500.0000 mL | INTRAVENOUS | Status: DC | PRN
  Administered 2019-02-09: 500 mL via INTRAVENOUS

## 2019-02-09 MED ORDER — FENTANYL CITRATE (PF) 100 MCG/2ML IJ SOLN: 50.0000 ug | INTRAMUSCULAR | Status: DC | PRN

## 2019-02-09 MED ORDER — TERBUTALINE SULFATE 1 MG/ML IJ SOLN: 0.2500 mg | Freq: Once | INTRAMUSCULAR | Status: DC | PRN

## 2019-02-09 MED ORDER — OXYTOCIN BOLUS FROM INFUSION
500.0000 mL | Freq: Once | INTRAVENOUS | Status: AC
  Administered 2019-02-09: 500 mL via INTRAVENOUS

## 2019-02-09 MED ORDER — ONDANSETRON HCL 4 MG/2ML IJ SOLN
4.0000 mg | Freq: Four times a day (QID) | INTRAMUSCULAR | Status: DC | PRN
  Administered 2019-02-09: 4 mg via INTRAVENOUS
  Filled 2019-02-09: qty 2

## 2019-02-09 MED ORDER — MISOPROSTOL 50MCG HALF TABLET
50.0000 ug | ORAL_TABLET | ORAL | Status: DC | PRN
  Filled 2019-02-09: qty 1

## 2019-02-09 MED ORDER — SOD CITRATE-CITRIC ACID 500-334 MG/5ML PO SOLN: 30.0000 mL | ORAL | Status: DC | PRN

## 2019-02-09 MED ORDER — ACETAMINOPHEN 325 MG PO TABS: 650.0000 mg | ORAL_TABLET | ORAL | Status: DC | PRN

## 2019-02-09 MED ORDER — WITCH HAZEL-GLYCERIN EX PADS: 1.0000 "application " | MEDICATED_PAD | CUTANEOUS | Status: DC | PRN

## 2019-02-09 MED ORDER — COCONUT OIL OIL: 1.0000 "application " | TOPICAL_OIL | Status: DC | PRN

## 2019-02-09 MED ORDER — OXYCODONE HCL 5 MG PO TABS: 5.0000 mg | ORAL_TABLET | ORAL | Status: DC | PRN

## 2019-02-09 MED ORDER — LIDOCAINE HCL (PF) 1 % IJ SOLN
INTRAMUSCULAR | Status: DC | PRN
  Administered 2019-02-09 (×2): 5 mL via EPIDURAL

## 2019-02-09 MED ORDER — DIBUCAINE (PERIANAL) 1 % EX OINT: 1.0000 "application " | TOPICAL_OINTMENT | CUTANEOUS | Status: DC | PRN

## 2019-02-09 MED ORDER — ONDANSETRON HCL 4 MG PO TABS: 4.0000 mg | ORAL_TABLET | ORAL | Status: DC | PRN

## 2019-02-09 MED ORDER — METHYLERGONOVINE MALEATE 0.2 MG/ML IJ SOLN
0.2000 mg | Freq: Once | INTRAMUSCULAR | Status: AC
  Administered 2019-02-09: 0.2 mg via INTRAMUSCULAR

## 2019-02-09 MED ORDER — LACTATED RINGERS IV SOLN
INTRAVENOUS | Status: DC
  Administered 2019-02-09 (×2): via INTRAVENOUS

## 2019-02-09 MED ORDER — DIPHENHYDRAMINE HCL 25 MG PO CAPS: 25.0000 mg | ORAL_CAPSULE | Freq: Four times a day (QID) | ORAL | Status: DC | PRN

## 2019-02-09 MED ORDER — ONDANSETRON HCL 4 MG/2ML IJ SOLN: 4.0000 mg | INTRAMUSCULAR | Status: DC | PRN

## 2019-02-09 MED ORDER — ACETAMINOPHEN 325 MG PO TABS
650.0000 mg | ORAL_TABLET | ORAL | Status: DC | PRN
  Administered 2019-02-09 – 2019-02-11 (×2): 650 mg via ORAL
  Filled 2019-02-09 (×2): qty 2

## 2019-02-09 MED ORDER — PRENATAL MULTIVITAMIN CH
1.0000 | ORAL_TABLET | Freq: Every day | ORAL | Status: DC
  Administered 2019-02-10: 1 via ORAL
  Filled 2019-02-09: qty 1

## 2019-02-09 MED ORDER — SENNOSIDES-DOCUSATE SODIUM 8.6-50 MG PO TABS
2.0000 | ORAL_TABLET | ORAL | Status: DC
  Administered 2019-02-09 – 2019-02-10 (×2): 2 via ORAL
  Filled 2019-02-09 (×2): qty 2

## 2019-02-09 MED ORDER — OXYTOCIN 40 UNITS IN NORMAL SALINE INFUSION - SIMPLE MED
2.5000 [IU]/h | INTRAVENOUS | Status: DC
  Filled 2019-02-09: qty 1000

## 2019-02-09 NOTE — H&P (Deleted)
  The note originally documented on this encounter has been moved the the encounter in which it belongs.  

## 2019-02-09 NOTE — Anesthesia Procedure Notes (Signed)
Epidural Patient location during procedure: OB Start time: 02/09/2019 6:23 AM End time: 02/09/2019 6:36 AM  Staffing Anesthesiologist: Duane Boston, MD Performed: anesthesiologist   Preanesthetic Checklist Completed: patient identified, site marked, pre-op evaluation, timeout performed, IV checked, risks and benefits discussed and monitors and equipment checked  Epidural Patient position: sitting Prep: DuraPrep Patient monitoring: heart rate, cardiac monitor, continuous pulse ox and blood pressure Approach: midline Location: L2-L3 Injection technique: LOR saline  Needle:  Needle type: Tuohy  Needle gauge: 17 G Needle length: 9 cm Needle insertion depth: 5 cm Catheter size: 20 Guage Catheter at skin depth: 10 cm Test dose: negative and Other  Assessment Events: blood not aspirated, injection not painful, no injection resistance and negative IV test  Additional Notes Informed consent obtained prior to proceeding including risk of failure, 1% risk of PDPH, risk of minor discomfort and bruising.  Discussed rare but serious complications including epidural abscess, permanent nerve injury, epidural hematoma.  Discussed alternatives to epidural analgesia and patient desires to proceed.  Timeout performed pre-procedure verifying patient name, procedure, and platelet count.  Patient tolerated procedure well.

## 2019-02-09 NOTE — Anesthesia Preprocedure Evaluation (Signed)
Anesthesia Evaluation  Patient identified by MRN, date of birth, ID band Patient awake    Reviewed: Allergy & Precautions, NPO status , Patient's Chart, lab work & pertinent test results  History of Anesthesia Complications Negative for: history of anesthetic complications  Airway Mallampati: II  TM Distance: >3 FB Neck ROM: Full    Dental no notable dental hx. (+) Dental Advisory Given   Pulmonary neg pulmonary ROS,  COVID positive   Pulmonary exam normal        Cardiovascular negative cardio ROS Normal cardiovascular exam     Neuro/Psych negative neurological ROS     GI/Hepatic negative GI ROS, Neg liver ROS,   Endo/Other  negative endocrine ROS  Renal/GU negative Renal ROS     Musculoskeletal negative musculoskeletal ROS (+)   Abdominal   Peds  Hematology negative hematology ROS (+)   Anesthesia Other Findings Day of surgery medications reviewed with the patient.  Reproductive/Obstetrics                             Anesthesia Physical Anesthesia Plan  ASA: III  Anesthesia Plan: Epidural   Post-op Pain Management:    Induction:   PONV Risk Score and Plan:   Airway Management Planned: Natural Airway  Additional Equipment:   Intra-op Plan:   Post-operative Plan:   Informed Consent: I have reviewed the patients History and Physical, chart, labs and discussed the procedure including the risks, benefits and alternatives for the proposed anesthesia with the patient or authorized representative who has indicated his/her understanding and acceptance.     Dental advisory given  Plan Discussed with: Anesthesiologist  Anesthesia Plan Comments:         Anesthesia Quick Evaluation

## 2019-02-09 NOTE — MAU Note (Signed)
Patient presents to MAU c/o ROM at 0100, clear fluid with white specks. +FM, bloody show

## 2019-02-09 NOTE — Discharge Summary (Signed)
Postpartum Discharge Summary      Patient Name: Laura Schmidt DOB: 1995/03/19 MRN: [id number]  Date of admission: 02/09/2019 Delivering Provider: Clarnce Flock   Date of discharge: 02/11/2019  Admitting diagnosis: 39 WKS, CTX Intrauterine pregnancy: [redacted]w[redacted]d    Secondary diagnosis:  Active Problems:   Supervision of low-risk pregnancy   Language barrier   Umbilical vein abnormality affecting pregnancy   COVID-19 affecting pregnancy in third trimester   Type 3a perineal laceration  Additional problems: none     Discharge diagnosis: Term Pregnancy Delivered                                                                                                Post partum procedures:none  Augmentation: Pitocin  Complications: None  Hospital course:  Onset of Labor With Vaginal Delivery     23y.o. yo G1P1001 at 426w0das admitted in Latent Labor on 02/09/2019. Patient had an uncomplicated labor course as follows: arrived at 2 cm after SROM and managed expectantly, at 3 cm started on pitocin and progressed to fully dilated. Membrane Rupture Time/Date: 1:00 AM ,02/09/2019   Intrapartum Procedures: Episiotomy: None [1]                                         Lacerations:  3rd degree [4]  Patient had a delivery of a Viable infant. 02/09/2019  Information for the patient's newborn:  BeLizza, Huffakerirl NoKamren0[[id number]]Delivery Method: Vag-Spont     Pateint had an uncomplicated postpartum course.  She is ambulating, tolerating a regular diet, passing flatus, and urinating well. Plans to use NFP for contraception. She remained asymptomatic from Covid during her hospital stay. Patient is discharged home in stable condition on 02/11/19.  Delivery time: 1:39 PM    Magnesium Sulfate received: No BMZ received: No Rhophylac:N/A MMR:N/A Transfusion:No  Physical exam  Vitals:   02/10/19 0510 02/10/19 1638 02/10/19 2023 02/11/19 0559  BP: 116/78 113/66 100/67 98/65  Pulse: 88 96  73 76  Resp: 18  16 16   Temp: 98.1 F (36.7 C) 98.3 F (36.8 C) 98.3 F (36.8 C) 98 F (36.7 C)  TempSrc:  Oral Oral Oral  SpO2: 99% 100%    Weight:      Height:       General: alert and cooperative Lochia: appropriate Uterine Fundus: firm Incision: N/A DVT Evaluation: No evidence of DVT seen on physical exam. Labs: Lab Results  Component Value Date   WBC 11.9 (H) 02/09/2019   HGB 12.4 02/09/2019   HCT 36.4 02/09/2019   MCV 89.0 02/09/2019   PLT 221 02/09/2019   CMP Latest Ref Rng & Units 01/24/2019  Glucose 70 - 99 mg/dL 92  BUN 6 - 20 mg/dL 7  Creatinine 0.44 - 1.00 mg/dL 0.44  Sodium 135 - 145 mmol/L 137  Potassium 3.5 - 5.1 mmol/L 3.5  Chloride 98 - 111 mmol/L 103  CO2 22 - 32 mmol/L 21(L)  Calcium 8.9 - 10.3 mg/dL 8.5(L)  Total  Protein 6.5 - 8.1 g/dL 5.9(L)  Total Bilirubin 0.3 - 1.2 mg/dL 0.5  Alkaline Phos 38 - 126 U/L 80  AST 15 - 41 U/L 18  ALT 0 - 44 U/L 11    Discharge instruction: per After Visit Summary and "Baby and Me Booklet".  After visit meds:  Allergies as of 02/11/2019   No Known Allergies     Medication List    STOP taking these medications   AMBULATORY NON FORMULARY MEDICATION   ondansetron 4 MG disintegrating tablet Commonly known as: Zofran ODT     TAKE these medications   ibuprofen 600 MG tablet Commonly known as: ADVIL Take 1 tablet (600 mg total) by mouth every 6 (six) hours as needed for mild pain.   PRENATAL VITAMINS PO Take 1 tablet by mouth daily.       Diet: routine diet  Activity: Advance as tolerated. Pelvic rest for 6 weeks.   Outpatient follow up:4 weeks Follow up Appt: Future Appointments  Date Time Provider [place]  03/09/2019  8:15 AM Starr Lake, CNM [facility]   Follow up Visit:   Please schedule this patient for Postpartum visit in: 6 weeks with the following provider: Any provider For C/S patients schedule nurse incision check in weeks 2 weeks: no High risk  pregnancy complicated by: +COVID Delivery mode:  SVD Anticipated Birth Control: NFP PP Procedures needed: none  Schedule Integrated BH visit: no   Newborn Data: Live born female  Birth Weight: 7 lb 13.2 oz (3549 g) APGAR: 9, 9  Newborn Delivery   Birth date/time: 02/09/2019 13:39:00 Delivery type: Vaginal, Spontaneous      Baby Feeding: Bottle and Breast Disposition:home with mother   02/11/2019 Myrtis Ser, CNM  8:42 AM

## 2019-02-09 NOTE — H&P (Signed)
Laura Schmidt is a 23 y.o. female G1P0 with IUP at [redacted]w[redacted]d presenting for ROM around midnight. She has since started having ctx, getting stronger. [facility].  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 Anemia     Past Surgical History: Past Surgical History:  Procedure Laterality Date  . NO PAST SURGERIES      Obstetrical History: OB History    Gravida  1   Para      Term      Preterm      AB      Living        SAB      TAB      Ectopic      Multiple      Live Births               Social History: Social History   Socioeconomic History  . Marital status: Married    Spouse name: Not on file  . Number of children: Not on file  . Years of education: Not on file  . Highest education level: Not on file  Occupational History  . Not on file  Social Needs  . Financial resource strain: Not on file  . Food insecurity    Worry: Never true    Inability: Never true  . Transportation needs    Medical: No    Non-medical: No  Tobacco Use  . Smoking status: Never Smoker  . Smokeless tobacco: Never Used  Substance and Sexual Activity  . Alcohol use: Never    Frequency: Never  . Drug use: Never  . Sexual activity: Not Currently    Birth control/protection: None  Lifestyle  . Physical activity    Days per week: Not on file    Minutes per session: Not on file  . Stress: Not on file  Relationships  . Social Musician on phone: Not on file    Gets together: Not on file    Attends religious service: Not on file    Active member of club or organization: Not on file    Attends meetings of clubs or organizations: Not on file    Relationship status: Not on file  Other Topics Concern  . Not on file  Social History Narrative  . Not on file    Family History: Family History   Problem Relation Age of Onset  . Diabetes Mother     Allergies: No Known Allergies   Review of Systems   Constitutional: Negative for fever and chills Eyes: Negative for visual disturbances Respiratory: Negative for shortness of breath, dyspnea Cardiovascular: Negative for chest pain or palpitations  Gastrointestinal: Negative for vomiting, diarrhea and constipation.  POSITIVE for abdominal pain (contractions) Genitourinary: Negative for dysuria and urgency Musculoskeletal: Negative for back pain, joint pain, myalgias  Neurological: Negative for dizziness and headaches   Last menstrual period 05/05/2018. General appearance: alert, cooperative and no distress Lungs: normal respiratory effort Heart: regular rate and rhythm Abdomen: soft, non-tender; bowel sounds normal Extremities: Homans sign is negative, no sign of DVT DTR's 2+ Presentation: cephalic Fetal monitoring  Baseline: 140 bpm, Variability: Good {> 6 bpm), Accelerations: Reactive and Decelerations: Absent Uterine activity  3-5, getting stronger     Prenatal labs: ABO, Rh: O/Positive/-- (06/16 1043) Antibody: Negative (06/16 1043) Rubella: 21.40 (06/16 1043) RPR: Non Reactive (10/05  2297)  HBsAg: Negative (06/16 1043)  HIV: Non Reactive (10/05 0951)  GBS: Negative/-- (11/02 1208)   Prenatal Transfer Tool  Maternal Diabetes: No Genetic Screening: Normal Maternal Ultrasounds/Referrals: Other:UVV , marginal cord insertion Fetal Ultrasounds or other Referrals:  Referred to Materal Fetal Medicine  Maternal Substance Abuse:  No Significant Maternal Medications:  None Significant Maternal Lab Results: Group B Strep negative and COVID + 11/28    Nursing Staff Provider  Office Location  [facility] Dating   LMP  Language   Arabic Anatomy US   Normal, incomplete - FU 10/13/18  Flu Vaccine   Genetic Screen  NIPS: low risk female  AFP:    TDaP vaccine   12/14/2018 Hgb A1C or  GTT  Third trimester 1hr 106  Rhogam    NA   LAB RESULTS   Feeding Plan Breast Blood Type   O positive  Contraception  declines Antibody  negative  Circumcision  yes out patient  Rubella  immune  Pediatrician  List given RPR   negative  Support Person Laura Schmidt(FOB) HBsAg   negative  Prenatal Classes  HIV  negative  BTL Consent  GBS  negative   VBAC Consent  Pap  08/2018, NIL    Hgb Electro   Normal adult    CF  Negative    SMA  Negative    Waterbirth  [ ]  Class [ ]  Consent [ ]  CNM visit    No results found for this or any previous visit (from the past 24 hour(s)).  Assessment: Laura Schmidt is a 23 y.o. G1P0 with an IUP at [redacted]w[redacted]d presenting for ROM, early labor  Plan: #Labor: expectant management, will augment PRN #Pain:  Per request #FWB Cat 1 #ID  COVID precautions   Christin Fudge 02/09/2023, 1:23 AM

## 2019-02-10 NOTE — Lactation Note (Signed)
This note was copied from a baby's chart. Lactation Consultation Note  Patient Name: Girl Carly Sabo [id number] Date: 02/10/2019 Reason for consult: Follow-up assessment;Term  P1 mother whose infant is now 63 hours old.  Mother is Covid +.    [employer] Arabic interpreter used for interpretation.  RN requested latch assistance.  When I arrived baby was asleep in mother's arms.  Mother informed me that her RN assisted her and baby fed for approximately 10 minutes.  When I asked how long ago she fed mother replied "10 minutes ago."  I offered to try to awaken baby if she wanted me to, however, baby was showing no cues and just finished feeding so she probably would not be interested.  Mother preferred to let her sleep.  When questioned, mother did not know how to do hand expression.  Offered to teach her and she was willing.  Mother's breasts are soft and non tender and nipples are everted and intact.  Demonstrated hand expression and was able to express colostrum drops which I finger fed back to baby.  She did not awaken after taking these drops.  Demonstrated breast massage and spoon feeding.  Encouraged mother to continue feeding 8-12 times/24 hours or sooner if baby shows cues.  She will call for latch assistance as needed.  Suggested she perform hand expression before/after feedings to help increase supply.  Mother does not have a DEBP for home use.  She does not have private insurance or participate in the [facility] program.  I suggested she call the [facility] office today to see about qualifying since she has Medicaid.  Mother verbalized understanding.  Mother's lunch arrived at the end of my visit and I swaddled baby and placed her in the bassinet.  Mother will call as needed for assistance.  No support person here at this time.  Rn updated.   Maternal Data    Feeding Feeding Type: Breast Fed  LATCH Score Latch: Repeated attempts needed to sustain latch, nipple held in mouth throughout  feeding, stimulation needed to elicit sucking reflex.  Audible Swallowing: A few with stimulation  Type of Nipple: Everted at rest and after stimulation  Comfort (Breast/Nipple): Soft / non-tender  Hold (Positioning): Assistance needed to correctly position infant at breast and maintain latch.  LATCH Score: 7  Interventions    Lactation Tools Discussed/Used     Consult Status Consult Status: Follow-up Date: 02/11/19 Follow-up type: In-patient    Khanh Tanori R Carlis Blanchard 02/10/2019, 3:22 PM

## 2019-02-10 NOTE — Anesthesia Postprocedure Evaluation (Signed)
Anesthesia Post Note  Patient: Nani Litaker  Procedure(s) Performed: AN AD HOC LABOR EPIDURAL     Patient location during evaluation: Mother Baby Anesthesia Type: Epidural Level of consciousness: awake and oriented Pain management: pain level controlled Vital Signs Assessment: post-procedure vital signs reviewed and stable Respiratory status: spontaneous breathing and respiratory function stable Cardiovascular status: blood pressure returned to baseline Postop Assessment: no headache, epidural receding, patient able to bend at knees, adequate PO intake, no backache, no apparent nausea or vomiting and able to ambulate Anesthetic complications: no Comments: Report from [place], RN    Last Vitals:  Vitals:   02/09/19 2347 02/10/19 0510  BP: 94/65 116/78  Pulse: 72 88  Resp: 18 18  Temp: 36.7 C 36.7 C  SpO2:  99%    Last Pain:  Vitals:   02/10/19 1131  TempSrc:   PainSc: 4    Pain Goal:                   Bufford Spikes

## 2019-02-10 NOTE — Lactation Note (Signed)
This note was copied from a baby's chart. Lactation Consultation Note  Patient Name: Girl Betty Daidone [id number] Date: 02/10/2019    [facility] Follow Up Visit:  Night shift LC had seen mother approximately one hour ago.  RN aware and she will call me as needed for assistance.   Consult Status Consult Status: Follow-up Date: 02/11/19 Follow-up type: In-patient    Treyce Spillers R Lamarkus Nebel 02/10/2019, 11:11 AM

## 2019-02-10 NOTE — Lactation Note (Addendum)
This note was copied from a baby's chart. Lactation Consultation Note  Patient Name: Laura Schmidt [id number] Date: 02/10/2019 Reason for consult: Initial assessment;1st time breastfeeding;Infant weight loss P1, 17 hour female infant, weight loss -2%. Mom is COVID+ Per mom, infant been breastfeeding most feeding 15 to 20 minutes, mom feels breastfeeding is going well. Arabic interpreter used [place] # [id number]  Infant had 2 stools but no wet diapers yet. Mom taught back hand expression and colostrum is present in breast. Per mom, infant latched three times since delivery and mom will breastfeed according hunger cues and not every 4 hours. Mom latched infant on right breast using cross cradle hold, infant latched without difficulty and sustained latch, swallows observed and infant was still breastfeeding after 6 minutes when [place] left room. Mom knows to breastfeed according to hunger cues, 8 to 12 times within 24 hours and on demand. Mom knows to call RN or LC if she has any questions, concerns or need assistance with latching infant to breast. Mom made aware of O/P services, breastfeeding support groups, community resources, and our phone # for post-discharge questions.  Maternal Data Formula Feeding for Exclusion: No Has patient been taught Hand Expression?: Yes(Mom has colostrum present in both breast she demostrated hand expression.) Does the patient have breastfeeding experience prior to this delivery?: No  Feeding Feeding Type: Breast Fed  LATCH Score Latch: Grasps breast easily, tongue down, lips flanged, rhythmical sucking.  Audible Swallowing: Spontaneous and intermittent  Type of Nipple: Everted at rest and after stimulation  Comfort (Breast/Nipple): Soft / non-tender  Hold (Positioning): Assistance needed to correctly position infant at breast and maintain latch.  LATCH Score: 9  Interventions Interventions: Breast feeding basics reviewed;Assisted with latch;Adjust  position;Skin to skin;Support pillows;Breast massage;Position options;Hand express;Expressed milk  Lactation Tools Discussed/Used WIC Program: No   Consult Status Consult Status: Follow-up Date: 02/11/19 Follow-up type: In-patient    Vicente Serene 02/10/2019, 7:01 AM

## 2019-02-10 NOTE — Progress Notes (Signed)
POSTPARTUM PROGRESS NOTE  Post Partum Day 1  Subjective:  Laura Schmidt is a 23 y.o. G1P1001 s/p NSVD at [redacted]w[redacted]d.  She reports she is doing well. No acute events overnight. She denies any problems with ambulating, voiding or po intake. Denies nausea or vomiting.  Pain is well controlled.  Lochia is appropriate.  Objective: Blood pressure 116/78, pulse 88, temperature 98.1 F (36.7 C), resp. rate 18, height 5\' 3"  (1.6 m), weight 72.6 kg, last menstrual period 05/05/2018, SpO2 99 %, unknown if currently breastfeeding.  Physical Exam:  General: alert, cooperative and no distress Chest: no respiratory distress Heart:regular rate, distal pulses intact Abdomen: soft, nontender,  Uterine Fundus: firm, appropriately tender DVT Evaluation: No calf swelling or tenderness Extremities: no LE edema Skin: warm, dry  Recent Labs    02/09/19 0246  HGB 12.4  HCT 36.4    Assessment/Plan: Laura Schmidt is a 23 y.o. G1P1001 s/p NSVD at [redacted]w[redacted]d   PPD#1 - Doing well  Routine postpartum care BP: single elevated BP, asymptomatic Contraception: declines Feeding: breast Dispo: Plan for discharge PPD#1-2, would like to go today but unclear if baby will be discharged.   LOS: 1 day   Augustin Coupe, MD/[facility] OB Fellow  02/10/2019, 10:28 AM

## 2019-02-11 MED ORDER — IBUPROFEN 600 MG PO TABS: 600.0000 mg | ORAL_TABLET | Freq: Four times a day (QID) | ORAL | 0 refills | Status: DC | PRN

## 2019-02-11 NOTE — Discharge Instructions (Signed)

## 2019-03-09 ENCOUNTER — Encounter: Payer: Self-pay | Admitting: Family Medicine

## 2019-03-09 ENCOUNTER — Other Ambulatory Visit: Payer: Self-pay

## 2019-03-09 ENCOUNTER — Ambulatory Visit: Payer: Medicaid Other | Admitting: Student

## 2019-03-09 DIAGNOSIS — Z5329 Procedure and treatment not carried out because of patient's decision for other reasons: Secondary | ICD-10-CM

## 2019-03-09 DIAGNOSIS — Z91199 Patient's noncompliance with other medical treatment and regimen due to unspecified reason: Secondary | ICD-10-CM

## 2019-03-09 NOTE — Progress Notes (Signed)
8:20a-Called pt for virtual visit, no answer, used [employer]preter forArabic [place]delrah id # [id number] answer, left VM that will call back In 10 to 15 minutes.    8:42a-2nd attempt, used [place] id# [phone], will need to be rescheduled.

## 2019-03-09 NOTE — Progress Notes (Signed)
Patient ID: Laura Schmidt, female   DOB: [birth year], 23 y.o.   MRN: [id number] Patient did not keep appointment.

## 2019-09-09 DIAGNOSIS — Z419 Encounter for procedure for purposes other than remedying health state, unspecified: Secondary | ICD-10-CM | POA: Diagnosis not present

## 2019-09-23 ENCOUNTER — Other Ambulatory Visit: Payer: Medicaid Other

## 2019-10-10 DIAGNOSIS — Z419 Encounter for procedure for purposes other than remedying health state, unspecified: Secondary | ICD-10-CM | POA: Diagnosis not present

## 2019-11-10 DIAGNOSIS — Z419 Encounter for procedure for purposes other than remedying health state, unspecified: Secondary | ICD-10-CM | POA: Diagnosis not present

## 2019-12-10 DIAGNOSIS — Z419 Encounter for procedure for purposes other than remedying health state, unspecified: Secondary | ICD-10-CM | POA: Diagnosis not present

## 2020-01-10 DIAGNOSIS — Z419 Encounter for procedure for purposes other than remedying health state, unspecified: Secondary | ICD-10-CM | POA: Diagnosis not present

## 2020-01-26 ENCOUNTER — Encounter (HOSPITAL_COMMUNITY): Payer: Self-pay | Admitting: Obstetrics and Gynecology

## 2020-01-26 ENCOUNTER — Other Ambulatory Visit: Payer: Self-pay

## 2020-01-26 ENCOUNTER — Inpatient Hospital Stay (HOSPITAL_COMMUNITY)
Admission: AD | Admit: 2020-01-26 | Discharge: 2020-01-26 | Disposition: A | Discharge status: Home / Self Care | Payer: Medicaid Other | Attending: Obstetrics and Gynecology | Admitting: Obstetrics and Gynecology

## 2020-01-26 ENCOUNTER — Inpatient Hospital Stay (HOSPITAL_COMMUNITY): Payer: Medicaid Other

## 2020-01-26 DIAGNOSIS — Z3A01 Less than 8 weeks gestation of pregnancy: Secondary | ICD-10-CM | POA: Diagnosis not present

## 2020-01-26 DIAGNOSIS — O3680X0 Pregnancy with inconclusive fetal viability, not applicable or unspecified: Secondary | ICD-10-CM | POA: Insufficient documentation

## 2020-01-26 DIAGNOSIS — Z3A00 Weeks of gestation of pregnancy not specified: Secondary | ICD-10-CM | POA: Diagnosis not present

## 2020-01-26 DIAGNOSIS — O26891 Other specified pregnancy related conditions, first trimester: Secondary | ICD-10-CM | POA: Diagnosis not present

## 2020-01-26 DIAGNOSIS — O209 Hemorrhage in early pregnancy, unspecified: Secondary | ICD-10-CM | POA: Diagnosis not present

## 2020-01-26 DIAGNOSIS — O4691 Antepartum hemorrhage, unspecified, first trimester: Secondary | ICD-10-CM | POA: Diagnosis not present

## 2020-01-26 DIAGNOSIS — R109 Unspecified abdominal pain: Secondary | ICD-10-CM

## 2020-01-26 LAB — URINALYSIS, ROUTINE W REFLEX MICROSCOPIC
Bacteria, UA: NONE SEEN
Bilirubin Urine: NEGATIVE
Glucose, UA: NEGATIVE mg/dL
Ketones, ur: NEGATIVE mg/dL
Leukocytes,Ua: NEGATIVE
Nitrite: NEGATIVE
Protein, ur: NEGATIVE mg/dL
Specific Gravity, Urine: 1.009 (ref 1.005–1.030)
pH: 7 (ref 5.0–8.0)

## 2020-01-26 LAB — CBC
HCT: 40.6 % (ref 36.0–46.0)
Hemoglobin: 13.3 g/dL (ref 12.0–15.0)
MCH: 26.9 pg (ref 26.0–34.0)
MCHC: 32.8 g/dL (ref 30.0–36.0)
MCV: 82 fL (ref 80.0–100.0)
Platelets: 303 10*3/uL (ref 150–400)
RBC: 4.95 MIL/uL (ref 3.87–5.11)
RDW: 14.1 % (ref 11.5–15.5)
WBC: 9.7 10*3/uL (ref 4.0–10.5)
nRBC: 0 % (ref 0.0–0.2)

## 2020-01-26 LAB — HCG, QUANTITATIVE, PREGNANCY: hCG, Beta Chain, Quant, S: 71 m[IU]/mL — ABNORMAL HIGH (ref <5)

## 2020-01-26 LAB — POCT PREGNANCY, URINE: Preg Test, Ur: POSITIVE — AB

## 2020-01-26 NOTE — MAU Note (Signed)
Laura Schmidt is a 24 y.o. here in MAU reporting: started having vaginal bleeding this AM. States it has gotten heavier, is wearing a pad. Has changed her pad twice. Having some lower abdominal pain also. Had + UPT the day before yesterday.  LMP: 12/22/19  Onset of complaint: today  Pain score: 5/10  Vitals:   01/26/20 1345  BP: 124/68  Pulse: 92  Resp: 16  Temp: 98.5 F (36.9 C)  SpO2: 100%     Lab orders placed from triage: UPT

## 2020-01-26 NOTE — MAU Provider Note (Signed)
History     CSN: [id number]  Arrival date and time: 01/26/20 1330   First Provider Initiated Contact with Patient 01/26/20 1422      Chief Complaint  Patient presents with  . Vaginal Bleeding  . Abdominal Pain   HPI Laura Schmidt is a 24 y.o. G2P1001 at [redacted]w[redacted]d by certain LMP who presents to MAU with chief complaint of vaginal bleeding in the setting of positive home pregnancy test. This is a new problem, onset this morning. Patient also endorses new onset abdominal cramping, onset coinciding with onset of bleeding. Her pain score is 5/10. She denies aggravating or alleviating factors. She has not taken medication or tried other treatments for this complaint. She denies abdominal tenderness, dysuria, fever or recent illness. She is remote from sexual intercourse.  OB History    Gravida  2   Para  1   Term  1   Preterm      AB      Living  1     SAB      TAB      Ectopic      Multiple  0   Live Births  1           Past Medical History:  Diagnosis Date  . Anemia     Past Surgical History:  Procedure Laterality Date  . NO PAST SURGERIES      Family History  Problem Relation Age of Onset  . Diabetes Mother     Social History   Tobacco Use  . Smoking status: Never Smoker  . Smokeless tobacco: Never Used  Vaping Use  . Vaping Use: Never used  Substance Use Topics  . Alcohol use: Never  . Drug use: Never    Allergies: No Known Allergies  Medications Prior to Admission  Medication Sig Dispense Refill Last Dose  . Prenatal Vit-Fe Fumarate-FA (PRENATAL VITAMINS PO) Take 1 tablet by mouth daily.   01/25/2020 at Unknown time  . ibuprofen (ADVIL) 600 MG tablet Take 1 tablet (600 mg total) by mouth every 6 (six) hours as needed for mild pain. 30 tablet 0     Review of Systems  Gastrointestinal: Positive for abdominal pain.  Genitourinary: Positive for vaginal bleeding.  All other systems reviewed and are negative.  Physical Exam   Blood  pressure 124/68, pulse 92, temperature 98.5 F (36.9 C), temperature source Oral, resp. rate 16, height 5' 2.99" (1.6 m), weight 64.7 kg, last menstrual period 12/22/2019, SpO2 100 %, unknown if currently breastfeeding.  Physical Exam Vitals and nursing note reviewed. Exam conducted with a chaperone present.  Cardiovascular:     Rate and Rhythm: Normal rate.  Pulmonary:     Effort: Pulmonary effort is normal.  Abdominal:     General: Abdomen is flat.     Palpations: Abdomen is soft.     Tenderness: There is no abdominal tenderness. There is no right CVA tenderness or left CVA tenderness.  Genitourinary:    Comments: Deferred based on cultural preferences, pending [place] results Skin:    General: Skin is warm and dry.     Capillary Refill: Capillary refill takes less than 2 seconds.  Neurological:     General: No focal deficit present.     Mental Status: She is alert.  Psychiatric:        Mood and Affect: Mood normal.        Behavior: Behavior normal.     MAU Course/MDM  Procedures  --Discussed expectations for  ultrasound in setting of [redacted]w[redacted]d per LMP vs ultrasound results today. Additional workup advised for miscarriage vs pregnancy of unknown location. Reviewed bleeding precautions.   Orders Placed This Encounter  Procedures  . US OB Transvaginal  . Urinalysis, Routine w reflex microscopic Urine, Clean Catch  . CBC  . hCG, quantitative, pregnancy  . Pregnancy, urine POC   Patient Vitals for the past 24 hrs:  BP Temp Temp src Pulse Resp SpO2 Height Weight  01/26/20 1622 116/77 98.5 F (36.9 C) Oral 88 18 100 % -- --  01/26/20 1345 124/68 98.5 F (36.9 C) Oral 92 16 100 % -- --  01/26/20 1338 -- -- -- -- -- -- 5' 2.99" (1.6 m) 64.7 kg   Results for orders placed or performed during the hospital encounter of 01/26/20 (from the past 24 hour(s))  Urinalysis, Routine w reflex microscopic Urine, Clean Catch     Status: Abnormal   Collection Time: 01/26/20  1:55 PM  Result Value  Ref Range   Color, Urine YELLOW YELLOW   APPearance CLEAR CLEAR   Specific Gravity, Urine 1.009 1.005 - 1.030   pH 7.0 5.0 - 8.0   Glucose, UA NEGATIVE NEGATIVE mg/dL   Hgb urine dipstick SMALL (A) NEGATIVE   Bilirubin Urine NEGATIVE NEGATIVE   Ketones, ur NEGATIVE NEGATIVE mg/dL   Protein, ur NEGATIVE NEGATIVE mg/dL   Nitrite NEGATIVE NEGATIVE   Leukocytes,Ua NEGATIVE NEGATIVE   RBC / HPF 0-5 0 - 5 RBC/hpf   WBC, UA 0-5 0 - 5 WBC/hpf   Bacteria, UA NONE SEEN NONE SEEN   Squamous Epithelial / LPF 0-5 0 - 5  Pregnancy, urine POC     Status: Abnormal   Collection Time: 01/26/20  1:55 PM  Result Value Ref Range   Preg Test, Ur POSITIVE (A) NEGATIVE  CBC     Status: None   Collection Time: 01/26/20  2:21 PM  Result Value Ref Range   WBC 9.7 4.0 - 10.5 K/uL   RBC 4.95 3.87 - 5.11 MIL/uL   Hemoglobin 13.3 12.0 - 15.0 g/dL   HCT 78.9 36 - 46 %   MCV 82.0 80.0 - 100.0 fL   MCH 26.9 26.0 - 34.0 pg   MCHC 32.8 30.0 - 36.0 g/dL   RDW 38.1 01.7 - 51.0 %   Platelets 303 150 - 400 K/uL   nRBC 0.0 0.0 - 0.2 %  hCG, quantitative, pregnancy     Status: Abnormal   Collection Time: 01/26/20  2:21 PM  Result Value Ref Range   hCG, Beta Chain, Quant, S 71 (H) <5 mIU/mL   US OB Transvaginal  Result Date: 01/26/2020 CLINICAL DATA:  Vaginal bleeding in 1st trimester pregnancy. EXAM: OBSTETRIC <14 WK [place] AND TRANSVAGINAL OB US TECHNIQUE: Both transabdominal and transvaginal ultrasound examinations were performed for complete evaluation of the gestation as well as the maternal uterus, adnexal regions, and pelvic cul-de-sac. Transvaginal technique was performed to assess early pregnancy. COMPARISON:  None. FINDINGS: Intrauterine gestational sac: None Maternal uterus/adnexae: Endometrial thickness measures 14 mm. No fibroids identified. Both ovaries are normal in appearance. No mass or abnormal free fluid identified. IMPRESSION: Pregnancy of unknown anatomic location (no intrauterine gestational sac or  adnexal mass identified). Differential diagnosis includes recent spontaneous abortion, IUP too early to visualize, and non-visualized ectopic pregnancy. Recommend correlation with serial beta-hCG levels, and follow up US if warranted clinically. Electronically Signed   By: Danae Orleans M.D.   On: 01/26/2020 16:01    Assessment  and Plan  --24 y.o. G2P1001 PUL --Blood type O POS --Language barrier: Arabic interpreter present for all patient interaction --Discharge home in stable condition  F/U: --Patient to return to MAU Friday night/Saturday morning for repeat Stat Quant hCG  Calvert Cantor, CNM 01/26/2020, 5:31 PM

## 2020-01-26 NOTE — MAU Provider Note (Deleted)
°  History     CSN: [id number]  Arrival date and time: 01/26/20 1330   First Provider Initiated Contact with Patient 01/26/20 1422      Chief Complaint  Patient presents with   Vaginal Bleeding   Abdominal Pain   HPI  Patient is a 24 year old G2P1001. Patient reports vaginal bleeding that started this morning and has gotten heavier. Patient is wearing a pad and has changed it twice so far. Patient took an at home pregnancy test two days ago that was positive. Patient denies shortness of breath, fever, headache, and syncope.    Past Medical History:  Diagnosis Date   Anemia     Past Surgical History:  Procedure Laterality Date   NO PAST SURGERIES      Family History  Problem Relation Age of Onset   Diabetes Mother     Social History   Tobacco Use   Smoking status: Never Smoker   Smokeless tobacco: Never Used  Vaping Use   Vaping Use: Never used  Substance Use Topics   Alcohol use: Never   Drug use: Never    Allergies: No Known Allergies  Medications Prior to Admission  Medication Sig Dispense Refill Last Dose   Prenatal Vit-Fe Fumarate-FA (PRENATAL VITAMINS PO) Take 1 tablet by mouth daily.   01/25/2020 at Unknown time   ibuprofen (ADVIL) 600 MG tablet Take 1 tablet (600 mg total) by mouth every 6 (six) hours as needed for mild pain. 30 tablet 0     Review of Systems  Constitutional: Negative for fever.  Respiratory: Negative for shortness of breath.   Gastrointestinal: Negative for abdominal pain.  Genitourinary: Positive for vaginal bleeding.  Skin: Negative for pallor.  Neurological: Negative for syncope and headaches.   Physical Exam   Blood pressure 124/68, pulse 92, temperature 98.5 F (36.9 C), temperature source Oral, resp. rate 16, height 5' 2.99" (1.6 m), weight 64.7 kg, last menstrual period 12/22/2019, SpO2 100 %, unknown if currently breastfeeding.  Physical Exam Vitals and nursing note reviewed.  Cardiovascular:     Heart  sounds: Normal heart sounds.  Pulmonary:     Effort: Pulmonary effort is normal.     Breath sounds: Normal breath sounds.  Abdominal:     General: Abdomen is flat. Bowel sounds are normal.     Palpations: Abdomen is soft.     Tenderness: There is no abdominal tenderness.     Hernia: No hernia is present.  Skin:    General: Skin is warm and dry.     Coloration: Skin is not pale.  Neurological:     Mental Status: She is alert.  Psychiatric:        Mood and Affect: Mood normal.     MAU Course  Procedures - TVUS and abdominal US - CBC - bHCG - UA  Assessment and Plan  - Patient discharged at this time and told to follow up at MAU on Saturday morning for repeat bHCG testing.  - Patient educated on next steps depending on results of Saturday's repeat bHCG results.   Colman Cater PA-S 01/26/2020, 2:47 PM

## 2020-01-26 NOTE — Discharge Instructions (Signed)
Human Chorionic Gonadotropin Test Why am I having this test? A human chorionic gonadotropin (hCG) test is done to determine whether you are pregnant. It can also be used:  To diagnose an abnormal pregnancy.  To determine whether you have had a failed pregnancy (miscarriage) or are at risk of one. What is being tested? This test checks the level of the human chorionic gonadotropin (hCG) hormone in the blood. This hormone is produced during pregnancy by the cells that form the placenta. The placenta is the organ that grows inside your womb (uterus) to nourish a developing baby. When you are pregnant, hCG can be detected in your blood or urine 7 to 8 days before your missed period. It continues to go up for the first 8-10 weeks of pregnancy. The presence of hCG in your blood can be measured with several different types of tests. You may have:  A urine test. ? Because this hormone is eliminated from your body by your kidneys, you may have a urine test to find out whether you are pregnant. A home pregnancy test detects whether there is hCG in your urine. ? A urine test only shows whether there is hCG in your urine. It does not measure how much.  A qualitative blood test. ? You may have this type of blood test to find out if you are pregnant. ? This blood test only shows whether there is hCG in your blood. It does not measure how much.  A quantitative blood test. ? This type of blood test measures the amount of hCG in your blood. ? You may have this test to:  Diagnose an abnormal pregnancy.  Check whether you have had a miscarriage.  Determine whether you are at risk of a miscarriage. What kind of sample is taken?     Two kinds of samples may be collected to test for the hCG hormone.  Blood. It is usually collected by inserting a needle into a blood vessel.  Urine. It is usually collected by urinating into a germ-free (sterile) specimen cup. It is best to collect the sample the first  time you urinate in the morning. How do I prepare for this test? No preparation is needed for a blood test.  For the urine test:  Let your health care provider know about: ? All medicines you are taking, including vitamins, herbs, creams, and over-the-counter medicines. ? Any blood in your urine. This may interfere with the result.  Do not drink too much fluid. Drink as you normally would, or as directed by your health care provider. How are the results reported? Depending on the type of test that you have, your test results may be reported as values. Your health care provider will compare your results to normal ranges that were established after testing a large group of people (reference ranges). Reference ranges may vary among labs and hospitals. For this test, common reference ranges that show absence of pregnancy are:  Quantitative hCG blood levels: less than 5 IU/L. Other results will be reported as either positive or negative. For this test, normal results (meaning the absence of pregnancy) are:  Negative for hCG in the urine test.  Negative for hCG in the qualitative blood test. What do the results mean? Urine and qualitative blood test  A negative result could mean: ? That you are not pregnant. ? That the test was done too early in your pregnancy to detect hCG in your blood or urine. If you still have other signs   of pregnancy, the test will be repeated.  A positive result means: ? That you are most likely pregnant. Your health care provider may confirm your pregnancy with an imaging study (ultrasound) of your uterus, if needed. Quantitative blood test Results of the quantitative hCG blood test will be interpreted as follows:  Less than 5 IU/L: You are most likely not pregnant.  Greater than 25 IU/L: You are most likely pregnant.  hCG levels that are higher than expected: ? You are pregnant with twins. ? You have abnormal growths in the uterus.  hCG levels that are  rising more slowly than expected: ? You have an ectopic pregnancy (also called a tubal pregnancy).  hCG levels that are falling: ? You may be having a miscarriage. Talk with your health care provider about what your results mean. Questions to ask your health care provider Ask your health care provider, or the department that is doing the test:  When will my results be ready?  How will I get my results?  What are my treatment options?  What other tests do I need?  What are my next steps? Summary  A human chorionic gonadotropin test is done to determine whether you are pregnant.  When you are pregnant, hCG can be detected in your blood or urine 7 to 8 days before your missed period. It continues to go up for the first 8-10 weeks of pregnancy.  Your hCG level can be measured with different types of tests. You may have a urine test, a qualitative blood test, or a quantitative blood test.  Talk with your health care provider about what your results mean. This information is not intended to replace advice given to you by your health care provider. Make sure you discuss any questions you have with your health care provider. Document Revised: 01/27/2017 Document Reviewed: 01/27/2017 Elsevier Patient Education  2020 Elsevier Inc.  

## 2020-01-29 ENCOUNTER — Inpatient Hospital Stay (HOSPITAL_COMMUNITY)
Admission: AD | Admit: 2020-01-29 | Discharge: 2020-01-29 | Disposition: A | Discharge status: Home / Self Care | Payer: Medicaid Other | Attending: Obstetrics and Gynecology | Admitting: Obstetrics and Gynecology

## 2020-01-29 ENCOUNTER — Other Ambulatory Visit: Payer: Self-pay

## 2020-01-29 DIAGNOSIS — O039 Complete or unspecified spontaneous abortion without complication: Secondary | ICD-10-CM | POA: Insufficient documentation

## 2020-01-29 DIAGNOSIS — Z3A01 Less than 8 weeks gestation of pregnancy: Secondary | ICD-10-CM | POA: Diagnosis not present

## 2020-01-29 LAB — HCG, QUANTITATIVE, PREGNANCY: hCG, Beta Chain, Quant, S: 7 m[IU]/mL — ABNORMAL HIGH (ref <5)

## 2020-01-29 NOTE — Discharge Instructions (Signed)

## 2020-01-29 NOTE — MAU Note (Signed)
Laura Schmidt is a 24 y.o. at [redacted]w[redacted]d here in MAU reporting: here for follow up hcg. Is having some pain and bleeding, states it is the same as previous MAU visit.  Onset of complaint: ongoing  Pain score: 5/10  Vitals:   01/29/20 1303  BP: 108/64  Pulse: 92  Resp: 16  Temp: 98.3 F (36.8 C)  SpO2: 100%     Lab orders placed from triage: hcg

## 2020-01-29 NOTE — MAU Provider Note (Signed)
History   Chief Complaint:  Follow-up   Laura Schmidt is  24 y.o. G2P1001 Patient's last menstrual period was 12/22/2019.Marland Kitchen Patient is here for follow up of quantitative HCG and ongoing surveillance of pregnancy status. She is [redacted]w[redacted]d weeks gestation  by LMP.    Since her last visit, the patient is without new complaint. The patient reports bleeding as  none now.  She denies any pain.  General ROS:  negative  Her previous Quantitative HCG values are:  Results for Laura, Schmidt (MRN [id number]) as of 01/29/2020 14:52  Ref. Range 01/26/2020 14:21  HCG, Beta Chain, Quant, S Latest Ref Range: <5 mIU/mL 71 (H)   Physical Exam   Blood pressure 108/64, pulse 92, temperature 98.3 F (36.8 C), temperature source Oral, resp. rate 16, last menstrual period 12/22/2019, SpO2 100 %, unknown if currently breastfeeding.  Focused Gynecological Exam: examination not indicated  Labs: Results for orders placed or performed during the hospital encounter of 01/29/20 (from the past 24 hour(s))  hCG, quantitative, pregnancy   Collection Time: 01/29/20 12:44 PM  Result Value Ref Range   hCG, Beta Chain, Quant, S 7 (H) <5 mIU/mL    Assessment:   1. Miscarriage   2. [redacted] weeks gestation of pregnancy    Plan: -Discharge home in stable condition -Vaginal bleeding and pain precautions discussed -Patient advised to follow-up with [facility] in 1 week for repeat HCG and visit with a provider -Patient may return to MAU as needed or if her condition were to change or worsen  Rolm Bookbinder, CNM 01/29/2020, 2:52 PM

## 2020-02-01 ENCOUNTER — Other Ambulatory Visit: Payer: Self-pay | Admitting: *Deleted

## 2020-02-01 DIAGNOSIS — O039 Complete or unspecified spontaneous abortion without complication: Secondary | ICD-10-CM

## 2020-02-07 ENCOUNTER — Other Ambulatory Visit: Payer: Self-pay

## 2020-02-07 ENCOUNTER — Other Ambulatory Visit: Payer: Medicaid Other

## 2020-02-07 DIAGNOSIS — O039 Complete or unspecified spontaneous abortion without complication: Secondary | ICD-10-CM | POA: Diagnosis not present

## 2020-02-08 LAB — BETA HCG QUANT (REF LAB): hCG Quant: <1 m[IU]/mL

## 2020-02-09 DIAGNOSIS — Z419 Encounter for procedure for purposes other than remedying health state, unspecified: Secondary | ICD-10-CM | POA: Diagnosis not present

## 2020-02-14 ENCOUNTER — Ambulatory Visit: Payer: Medicaid Other | Admitting: Obstetrics and Gynecology

## 2020-02-21 ENCOUNTER — Other Ambulatory Visit: Payer: Self-pay

## 2020-02-21 ENCOUNTER — Ambulatory Visit (INDEPENDENT_AMBULATORY_CARE_PROVIDER_SITE_OTHER): Payer: Medicaid Other | Admitting: Obstetrics & Gynecology

## 2020-02-21 ENCOUNTER — Encounter: Payer: Self-pay | Admitting: Obstetrics & Gynecology

## 2020-02-21 VITALS — BP 125/61 | HR 82 | Ht 64.0 in | Wt 145.0 lb

## 2020-02-21 DIAGNOSIS — O039 Complete or unspecified spontaneous abortion without complication: Secondary | ICD-10-CM | POA: Diagnosis not present

## 2020-02-21 NOTE — Progress Notes (Signed)
GYNECOLOGY  VISIT  CC:   Follow up after miscarre  HPI: 24 y.o. G1P1001 Married Arabic female here for follow up after having a miscarriage at around 5 3/7.  Bleeding ended 01/26/2020.  She has not had a menstrual cycle yet.  Follow up HCG neg 02/07/20.  MBT )+/antibody screen negative 02/09/2019.  Denies pain or abnormal vaginal discharge.  Questions about reasons for miscarriage, risk for future miscarriage, timing to start trying again (as she and spouse do want future pregnancies) discussed.  She is still on a PNV and baby ASA discussed.  Also, testing for additional causes of miscarriage would be appropriate if has another.    GYNECOLOGIC HISTORY: Patient's last menstrual period was 12/22/2019. Contraception: declines  Patient Active Problem List   Diagnosis Date Noted  . Type 3a perineal laceration 02/09/2019  . Language barrier 08/06/2018    Past Medical History:  Diagnosis Date  . Anemia     Past Surgical History:  Procedure Laterality Date  . NO PAST SURGERIES      MEDS:   Current Outpatient Medications on File Prior to Visit  Medication Sig Dispense Refill  . Prenatal Vit-Fe Fumarate-FA (PRENATAL VITAMINS PO) Take 1 tablet by mouth daily.     No current facility-administered medications on file prior to visit.    ALLERGIES: Patient has no known allergies.  Family History  Problem Relation Age of Onset  . Diabetes Mother     SH:  Married, non smoker  Review of Systems  All other systems reviewed and are negative.   PHYSICAL EXAMINATION:    BP 125/61   Pulse 82   Ht 5\' 4"  (1.626 m)   Wt 145 lb (65.8 kg)   LMP 12/22/2019   Breastfeeding Unknown Comment: SAB  BMI 24.89 kg/m     General appearance: alert, cooperative and appears stated age No other physical exam performed  Assessment/Plan: 1. Miscarriage -HCG normal 02/07/2020 -Cont PNV -Declines Contraception/desires pregnancy -pt will call if doesn't start menses in two weeks and/or with  +UPT    17 minutes of total time was spent for this patient encounter, including preparation, face-to-face counseling with the patient and coordination of care, and documentation of the encounter.

## 2020-03-11 DIAGNOSIS — Z419 Encounter for procedure for purposes other than remedying health state, unspecified: Secondary | ICD-10-CM | POA: Diagnosis not present

## 2020-04-11 DIAGNOSIS — Z419 Encounter for procedure for purposes other than remedying health state, unspecified: Secondary | ICD-10-CM | POA: Diagnosis not present

## 2020-05-09 DIAGNOSIS — Z419 Encounter for procedure for purposes other than remedying health state, unspecified: Secondary | ICD-10-CM | POA: Diagnosis not present

## 2020-06-09 DIAGNOSIS — Z419 Encounter for procedure for purposes other than remedying health state, unspecified: Secondary | ICD-10-CM | POA: Diagnosis not present

## 2020-07-09 DIAGNOSIS — Z419 Encounter for procedure for purposes other than remedying health state, unspecified: Secondary | ICD-10-CM | POA: Diagnosis not present

## 2020-08-09 DIAGNOSIS — Z419 Encounter for procedure for purposes other than remedying health state, unspecified: Secondary | ICD-10-CM | POA: Diagnosis not present

## 2020-09-08 DIAGNOSIS — Z419 Encounter for procedure for purposes other than remedying health state, unspecified: Secondary | ICD-10-CM | POA: Diagnosis not present

## 2020-10-09 DIAGNOSIS — Z419 Encounter for procedure for purposes other than remedying health state, unspecified: Secondary | ICD-10-CM | POA: Diagnosis not present

## 2020-11-09 DIAGNOSIS — Z419 Encounter for procedure for purposes other than remedying health state, unspecified: Secondary | ICD-10-CM | POA: Diagnosis not present

## 2020-12-09 DIAGNOSIS — Z419 Encounter for procedure for purposes other than remedying health state, unspecified: Secondary | ICD-10-CM | POA: Diagnosis not present

## 2021-01-09 DIAGNOSIS — Z419 Encounter for procedure for purposes other than remedying health state, unspecified: Secondary | ICD-10-CM | POA: Diagnosis not present

## 2021-02-08 DIAGNOSIS — Z419 Encounter for procedure for purposes other than remedying health state, unspecified: Secondary | ICD-10-CM | POA: Diagnosis not present

## 2021-03-11 DIAGNOSIS — Z419 Encounter for procedure for purposes other than remedying health state, unspecified: Secondary | ICD-10-CM | POA: Diagnosis not present

## 2021-03-11 NOTE — L&D Delivery Note (Signed)
Patient was C/C/+1 and pushed for approx 40 minutes with epidural.    NSVD  female infant, Apgars 9/9, weight pending.   The patient had 2nd degree laceration repaired with 2-0 vicryl. Fundus was firm. EBL was expected amount. Placenta was delivered intact. Vagina was clear.  Delayed cord clamping done for 30-60 seconds while warming baby. Baby was vigorous and doing skin to skin with mother.  Philip Aspen

## 2021-04-11 DIAGNOSIS — Z419 Encounter for procedure for purposes other than remedying health state, unspecified: Secondary | ICD-10-CM | POA: Diagnosis not present

## 2021-05-09 DIAGNOSIS — Z419 Encounter for procedure for purposes other than remedying health state, unspecified: Secondary | ICD-10-CM | POA: Diagnosis not present

## 2021-06-09 DIAGNOSIS — Z419 Encounter for procedure for purposes other than remedying health state, unspecified: Secondary | ICD-10-CM | POA: Diagnosis not present

## 2021-07-03 IMAGING — US US MFM OB COMP + 14 WK
1 series · 14 of 28 positions shown · non-contrast
Comparison: none

[Series 1: us mfm ob comp + 14 wk · 120 acquisitions, 14 frames shown]
[im 5/120]
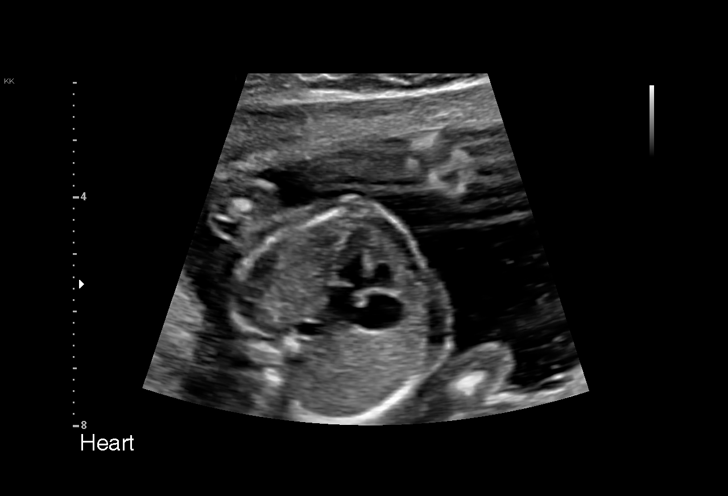
[im 14/120]
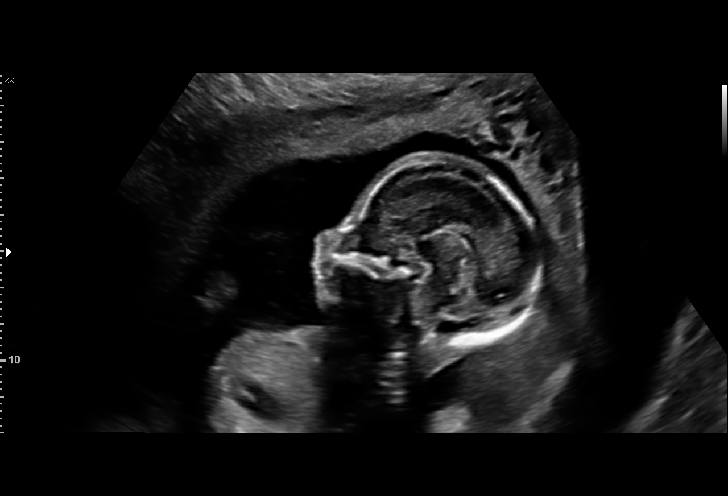
[im 23/120]
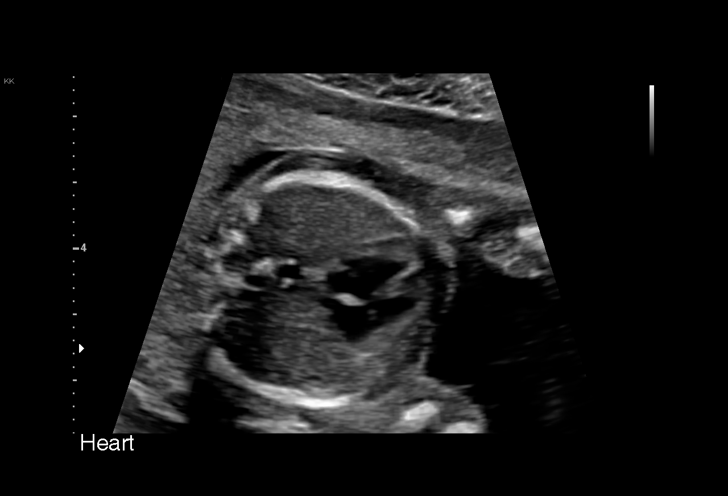
[im 31/120]
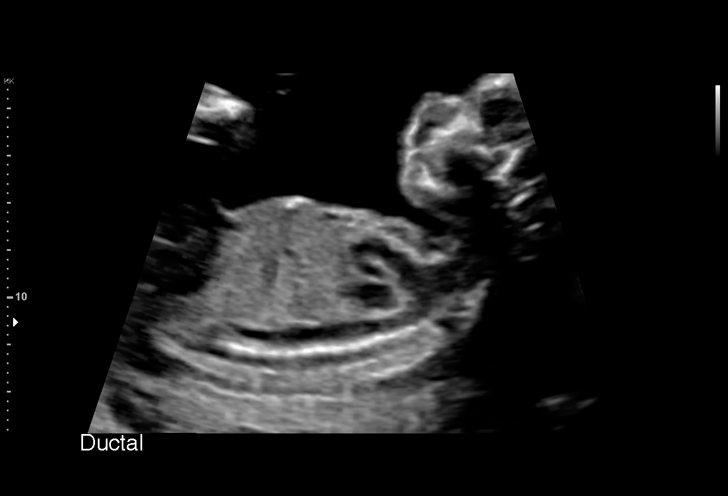
[im 40/120]
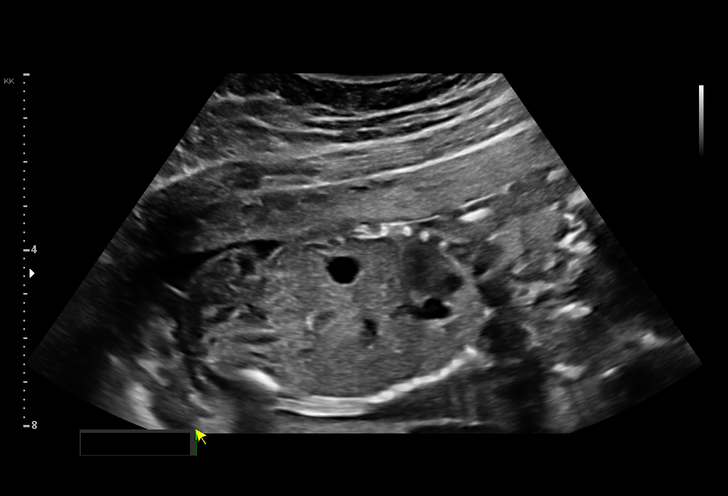
[im 49/120]
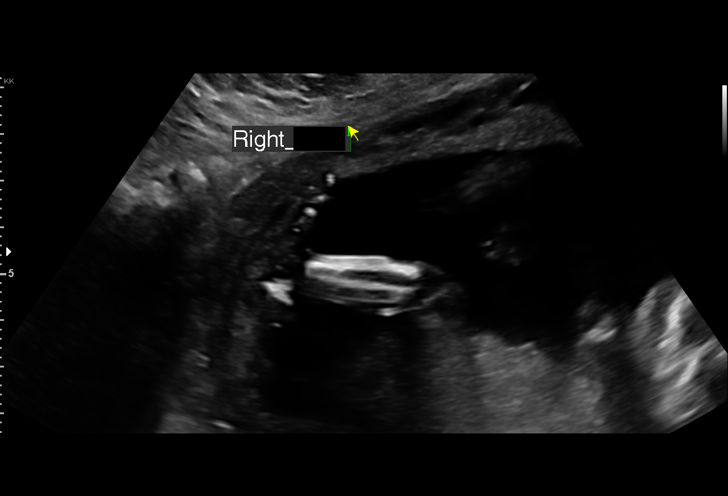
[im 58/120]
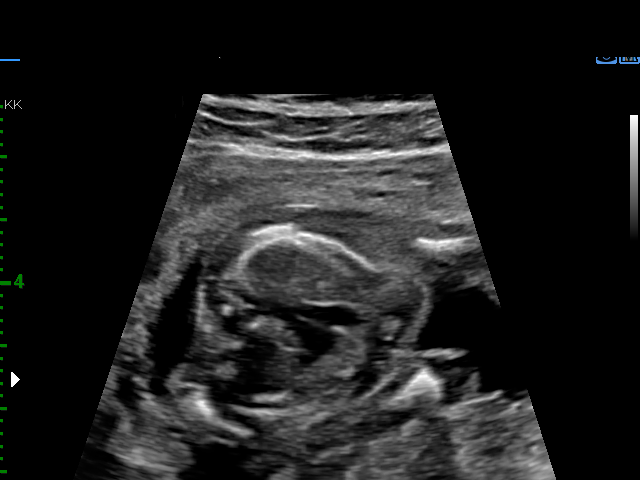
[im 67/120]
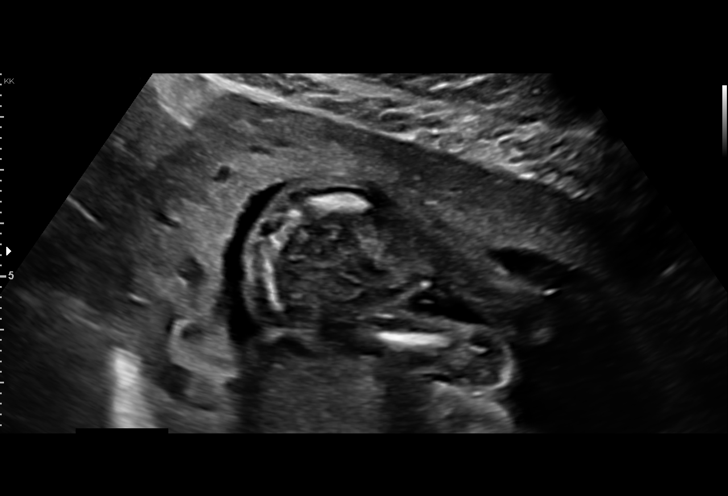
[im 75/120]
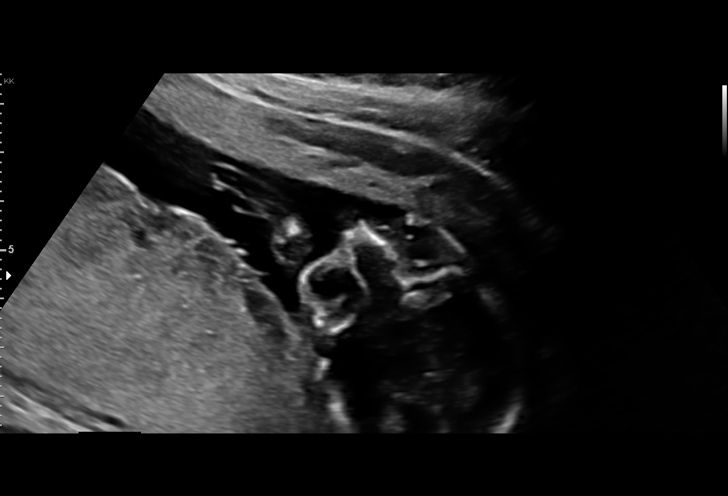
[im 84/120]
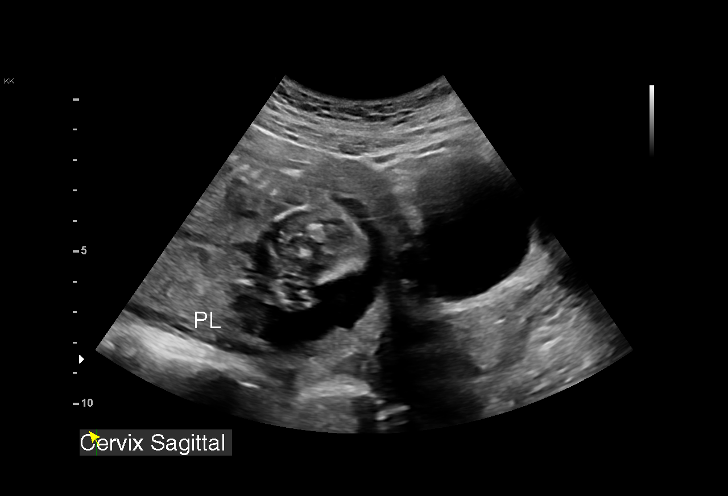
[im 93/120]
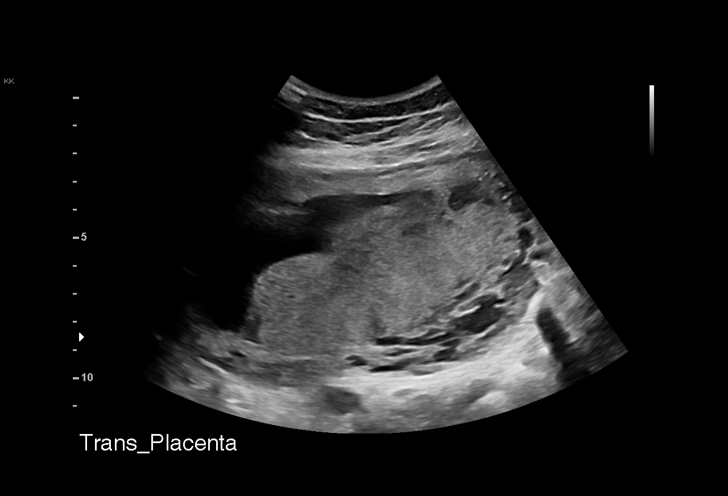
[im 102/120]
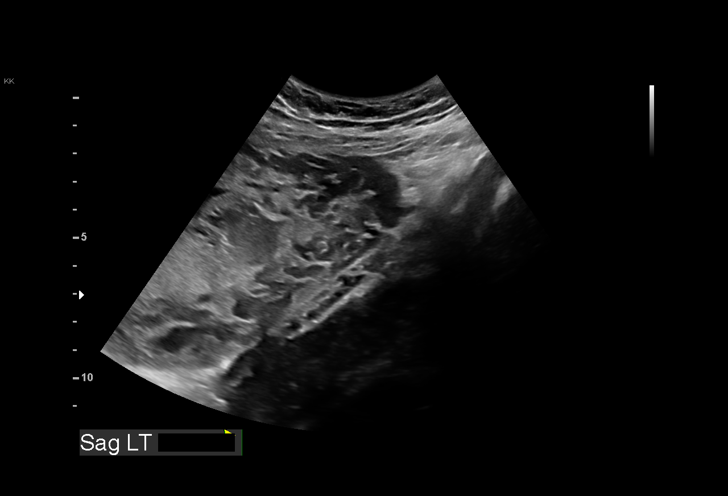
[im 111/120]
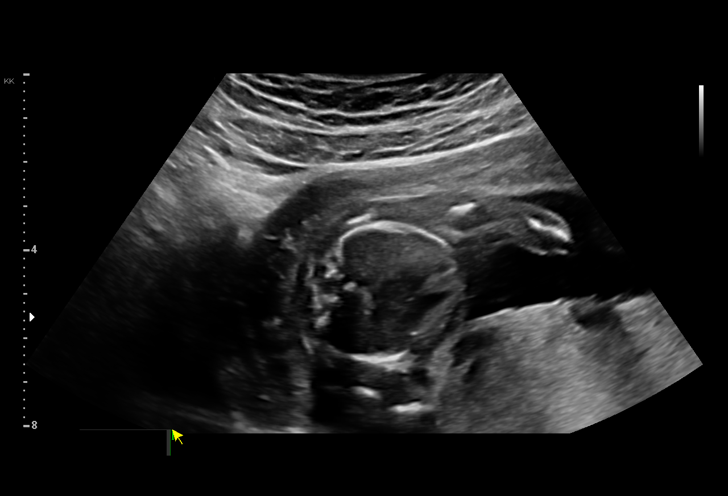
[im 120/120]
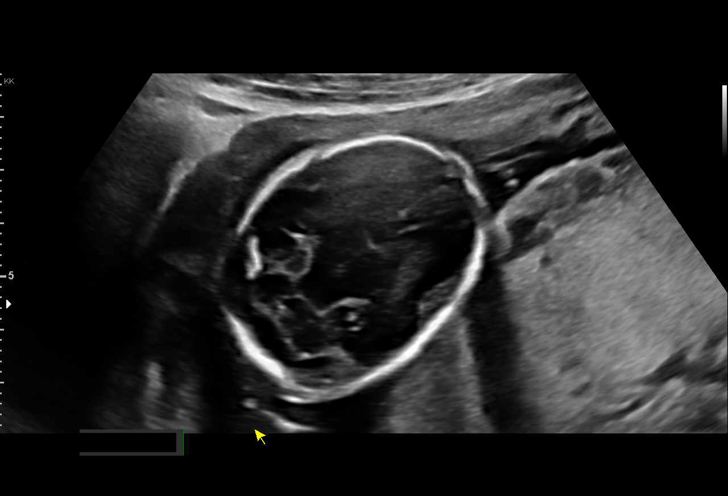

[14 of 28 positions shown; findings below may reference images not displayed]

CNM

 ----------------------------------------------------------------------

 ----------------------------------------------------------------------
Indications

  19 weeks gestation of pregnancy
  Encounter for antenatal screening for
  malformations
 ----------------------------------------------------------------------
Fetal Evaluation

 Num Of Fetuses:         1
 Fetal Heart Rate(bpm):  154
 Cardiac Activity:       Observed
 Presentation:           Cephalic
 Placenta:               Posterior
 P. Cord Insertion:      Visualized

 Amniotic Fluid
 AFI FV:      Within normal limits

                             Largest Pocket(cm)

Biometry

 BPD:      38.9  mm     G. Age:  17w 6d          9  %    CI:        64.75   %    70 - 86
                                                         FL/HC:      18.1   %    16.1 -
 HC:      155.4  mm     G. Age:  18w 3d         19  %    HC/AC:      1.06        1.09 -
 AC:      146.8  mm     G. Age:  20w 0d         77  %    FL/BPD:     72.2   %
 FL:       28.1  mm     G. Age:  18w 4d         29  %    FL/AC:      19.1   %    20 - 24

 Est. FW:     279  gm    0 lb 10 oz      57  %
OB History

 Gravidity:    1
Gestational Age

 LMP:           19w 0d        Date:  05/05/18                 EDD:   02/09/19
 U/S Today:     18w 5d                                        EDD:   02/11/19
 Best:          19w 0d     Det. By:  LMP  (05/05/18)          EDD:   02/09/19
Anatomy

 Cranium:               Appears normal         Aortic Arch:            Appears normal
 Cavum:                 Not well visualized    Ductal Arch:            Appears normal
 Ventricles:            Appears normal         Diaphragm:              Appears normal
 Choroid Plexus:        Appears normal         Stomach:                Appears normal, left
                                                                       sided
 Cerebellum:            Appears normal         Abdomen:                Appears normal
 Posterior Fossa:       Appears normal         Abdominal Wall:         Appears nml (cord
                                                                       insert, abd wall)
 Nuchal Fold:           Not well visualized    Cord Vessels:           Appears normal (3
                                                                       vessel cord)
 Face:                  Appears normal         Kidneys:                Appear normal
                        (orbits and profile)
 Lips:                  Appears normal         Bladder:                Appears normal
 Thoracic:              Appears normal         Spine:                  Limited views
                                                                       appear normal
 Heart:                 Appears normal         Upper Extremities:      Appears normal
                        (4CH, axis, and
                        situs)
 RVOT:                  Appears normal         Lower Extremities:      Appears normal
 LVOT:                  Appears normal

 Other:  Female gender Heels appear normal. Technically difficult due to fetal
         position.
Cervix Uterus Adnexa

 Cervix
 Length:            4.1  cm.
 Normal appearance by transabdominal scan.
Impression

 Normal interval growth.  No ultrasonic evidence of structural
 fetal anomalies.
 Suboptimal views of the fetal anatomy were obtained
 secondary to fetal position.
 While spine views were suboptimal the fetal intracranial
 anatomy
Recommendations

 Follow up growth in 4 weeks.

## 2021-07-09 DIAGNOSIS — Z419 Encounter for procedure for purposes other than remedying health state, unspecified: Secondary | ICD-10-CM | POA: Diagnosis not present

## 2021-07-17 DIAGNOSIS — O26841 Uterine size-date discrepancy, first trimester: Secondary | ICD-10-CM | POA: Diagnosis not present

## 2021-07-17 DIAGNOSIS — Z3201 Encounter for pregnancy test, result positive: Secondary | ICD-10-CM | POA: Diagnosis not present

## 2021-07-17 DIAGNOSIS — Z113 Encounter for screening for infections with a predominantly sexual mode of transmission: Secondary | ICD-10-CM | POA: Diagnosis not present

## 2021-07-17 DIAGNOSIS — Z124 Encounter for screening for malignant neoplasm of cervix: Secondary | ICD-10-CM | POA: Diagnosis not present

## 2021-07-17 DIAGNOSIS — Z3480 Encounter for supervision of other normal pregnancy, unspecified trimester: Secondary | ICD-10-CM | POA: Diagnosis not present

## 2021-07-17 DIAGNOSIS — Z3A09 9 weeks gestation of pregnancy: Secondary | ICD-10-CM | POA: Diagnosis not present

## 2021-07-17 LAB — OB RESULTS CONSOLE GC/CHLAMYDIA
Chlamydia: NEGATIVE
Neisseria Gonorrhea: NEGATIVE

## 2021-07-27 DIAGNOSIS — Z3481 Encounter for supervision of other normal pregnancy, first trimester: Secondary | ICD-10-CM | POA: Diagnosis not present

## 2021-07-27 DIAGNOSIS — R1013 Epigastric pain: Secondary | ICD-10-CM | POA: Diagnosis not present

## 2021-07-27 DIAGNOSIS — Z3480 Encounter for supervision of other normal pregnancy, unspecified trimester: Secondary | ICD-10-CM | POA: Diagnosis not present

## 2021-07-27 LAB — OB RESULTS CONSOLE ABO/RH: RH Type: POSITIVE

## 2021-07-27 LAB — OB RESULTS CONSOLE RPR: RPR: NONREACTIVE

## 2021-07-27 LAB — HEPATITIS C ANTIBODY: HCV Ab: NEGATIVE

## 2021-07-27 LAB — OB RESULTS CONSOLE ANTIBODY SCREEN: Antibody Screen: NEGATIVE

## 2021-07-27 LAB — OB RESULTS CONSOLE HIV ANTIBODY (ROUTINE TESTING): HIV: NONREACTIVE

## 2021-07-27 LAB — OB RESULTS CONSOLE RUBELLA ANTIBODY, IGM: Rubella: IMMUNE

## 2021-07-27 LAB — OB RESULTS CONSOLE HEPATITIS B SURFACE ANTIGEN: Hepatitis B Surface Ag: NEGATIVE

## 2021-08-09 DIAGNOSIS — Z419 Encounter for procedure for purposes other than remedying health state, unspecified: Secondary | ICD-10-CM | POA: Diagnosis not present

## 2021-09-06 DIAGNOSIS — Z3482 Encounter for supervision of other normal pregnancy, second trimester: Secondary | ICD-10-CM | POA: Diagnosis not present

## 2021-09-08 DIAGNOSIS — Z419 Encounter for procedure for purposes other than remedying health state, unspecified: Secondary | ICD-10-CM | POA: Diagnosis not present

## 2021-10-03 DIAGNOSIS — Z363 Encounter for antenatal screening for malformations: Secondary | ICD-10-CM | POA: Diagnosis not present

## 2021-10-03 DIAGNOSIS — Z3A20 20 weeks gestation of pregnancy: Secondary | ICD-10-CM | POA: Diagnosis not present

## 2021-10-09 DIAGNOSIS — Z419 Encounter for procedure for purposes other than remedying health state, unspecified: Secondary | ICD-10-CM | POA: Diagnosis not present

## 2021-11-09 DIAGNOSIS — Z419 Encounter for procedure for purposes other than remedying health state, unspecified: Secondary | ICD-10-CM | POA: Diagnosis not present

## 2021-11-28 DIAGNOSIS — Z3480 Encounter for supervision of other normal pregnancy, unspecified trimester: Secondary | ICD-10-CM | POA: Diagnosis not present

## 2021-12-09 DIAGNOSIS — Z419 Encounter for procedure for purposes other than remedying health state, unspecified: Secondary | ICD-10-CM | POA: Diagnosis not present

## 2022-01-04 DIAGNOSIS — O99019 Anemia complicating pregnancy, unspecified trimester: Secondary | ICD-10-CM | POA: Diagnosis not present

## 2022-01-09 DIAGNOSIS — Z419 Encounter for procedure for purposes other than remedying health state, unspecified: Secondary | ICD-10-CM | POA: Diagnosis not present

## 2022-01-18 DIAGNOSIS — R8290 Unspecified abnormal findings in urine: Secondary | ICD-10-CM | POA: Diagnosis not present

## 2022-01-29 DIAGNOSIS — Z3480 Encounter for supervision of other normal pregnancy, unspecified trimester: Secondary | ICD-10-CM | POA: Diagnosis not present

## 2022-01-29 LAB — OB RESULTS CONSOLE GBS: GBS: NEGATIVE

## 2022-02-08 DIAGNOSIS — Z419 Encounter for procedure for purposes other than remedying health state, unspecified: Secondary | ICD-10-CM | POA: Diagnosis not present

## 2022-02-14 ENCOUNTER — Telehealth (HOSPITAL_COMMUNITY): Payer: Self-pay | Admitting: *Deleted

## 2022-02-14 ENCOUNTER — Encounter (HOSPITAL_COMMUNITY): Payer: Self-pay | Admitting: *Deleted

## 2022-02-14 NOTE — Telephone Encounter (Addendum)
Preadmission screeninterpreter number [phone]

## 2022-02-15 ENCOUNTER — Encounter (HOSPITAL_COMMUNITY): Payer: Self-pay

## 2022-02-19 ENCOUNTER — Telehealth (HOSPITAL_COMMUNITY): Payer: Self-pay | Admitting: *Deleted

## 2022-02-19 ENCOUNTER — Encounter (HOSPITAL_COMMUNITY): Payer: Self-pay

## 2022-02-19 DIAGNOSIS — O480 Post-term pregnancy: Secondary | ICD-10-CM | POA: Diagnosis not present

## 2022-02-19 DIAGNOSIS — Z3A40 40 weeks gestation of pregnancy: Secondary | ICD-10-CM | POA: Diagnosis not present

## 2022-02-19 NOTE — Telephone Encounter (Signed)
Preadmission screen interpreter number [phone]

## 2022-02-20 ENCOUNTER — Telehealth (HOSPITAL_COMMUNITY): Payer: Self-pay | Admitting: *Deleted

## 2022-02-20 ENCOUNTER — Encounter (HOSPITAL_COMMUNITY): Payer: Self-pay | Admitting: *Deleted

## 2022-02-20 NOTE — Telephone Encounter (Signed)
Preadmission screen interpreter number [phone]

## 2022-02-22 ENCOUNTER — Inpatient Hospital Stay (HOSPITAL_COMMUNITY)
Admission: RE | Admit: 2022-02-22 | Payer: Medicaid Other | Source: Home / Self Care | Admitting: Obstetrics and Gynecology

## 2022-02-22 ENCOUNTER — Inpatient Hospital Stay (HOSPITAL_COMMUNITY): Payer: Medicaid Other | Admitting: Anesthesiology

## 2022-02-22 ENCOUNTER — Encounter (HOSPITAL_COMMUNITY): Payer: Self-pay | Admitting: Obstetrics and Gynecology

## 2022-02-22 ENCOUNTER — Inpatient Hospital Stay (HOSPITAL_COMMUNITY): Payer: Medicaid Other

## 2022-02-22 ENCOUNTER — Inpatient Hospital Stay (HOSPITAL_COMMUNITY)
Admission: AD | Admit: 2022-02-22 | Discharge: 2022-02-23 | DRG: 807 | Disposition: A | Discharge status: Home / Self Care | Payer: Medicaid Other | Attending: Obstetrics and Gynecology | Admitting: Obstetrics and Gynecology

## 2022-02-22 DIAGNOSIS — O480 Post-term pregnancy: Principal | ICD-10-CM | POA: Diagnosis present

## 2022-02-22 DIAGNOSIS — Z3A40 40 weeks gestation of pregnancy: Secondary | ICD-10-CM | POA: Diagnosis not present

## 2022-02-22 LAB — HIV ANTIBODY (ROUTINE TESTING W REFLEX): HIV Screen 4th Generation wRfx: NONREACTIVE

## 2022-02-22 LAB — CBC
HCT: 34.6 % — ABNORMAL LOW (ref 36.0–46.0)
Hemoglobin: 12 g/dL (ref 12.0–15.0)
MCH: 28.8 pg (ref 26.0–34.0)
MCHC: 34.7 g/dL (ref 30.0–36.0)
MCV: 83 fL (ref 80.0–100.0)
Platelets: 198 10*3/uL (ref 150–400)
RBC: 4.17 MIL/uL (ref 3.87–5.11)
RDW: 16.8 % — ABNORMAL HIGH (ref 11.5–15.5)
WBC: 10.9 10*3/uL — ABNORMAL HIGH (ref 4.0–10.5)
nRBC: 0 % (ref 0.0–0.2)

## 2022-02-22 LAB — RPR: RPR Ser Ql: NONREACTIVE

## 2022-02-22 LAB — TYPE AND SCREEN
ABO/RH(D): O POS
Antibody Screen: NEGATIVE

## 2022-02-22 LAB — POCT FERN TEST: POCT Fern Test: POSITIVE

## 2022-02-22 MED ORDER — BENZOCAINE-MENTHOL 20-0.5 % EX AERO
1.0000 | INHALATION_SPRAY | CUTANEOUS | Status: DC | PRN
  Administered 2022-02-22: 1 via TOPICAL
  Filled 2022-02-22: qty 56

## 2022-02-22 MED ORDER — ZOLPIDEM TARTRATE 5 MG PO TABS: 5.0000 mg | ORAL_TABLET | Freq: Every evening | ORAL | Status: DC | PRN

## 2022-02-22 MED ORDER — EPHEDRINE 5 MG/ML INJ: 10.0000 mg | INTRAVENOUS | Status: DC | PRN

## 2022-02-22 MED ORDER — OXYTOCIN-SODIUM CHLORIDE 30-0.9 UT/500ML-% IV SOLN
2.5000 [IU]/h | INTRAVENOUS | Status: DC
  Filled 2022-02-22: qty 500

## 2022-02-22 MED ORDER — SOD CITRATE-CITRIC ACID 500-334 MG/5ML PO SOLN: 30.0000 mL | ORAL | Status: DC | PRN

## 2022-02-22 MED ORDER — DIPHENHYDRAMINE HCL 50 MG/ML IJ SOLN: 12.5000 mg | INTRAMUSCULAR | Status: DC | PRN

## 2022-02-22 MED ORDER — OXYCODONE HCL 5 MG PO TABS: 5.0000 mg | ORAL_TABLET | ORAL | Status: DC | PRN

## 2022-02-22 MED ORDER — FENTANYL-BUPIVACAINE-NACL 0.5-0.125-0.9 MG/250ML-% EP SOLN
12.0000 mL/h | EPIDURAL | Status: DC | PRN
  Administered 2022-02-22: 12 mL/h via EPIDURAL
  Filled 2022-02-22: qty 250

## 2022-02-22 MED ORDER — LIDOCAINE HCL (PF) 1 % IJ SOLN
30.0000 mL | INTRAMUSCULAR | Status: AC | PRN
  Administered 2022-02-22: 30 mL via SUBCUTANEOUS
  Filled 2022-02-22: qty 30

## 2022-02-22 MED ORDER — ONDANSETRON HCL 4 MG/2ML IJ SOLN: 4.0000 mg | INTRAMUSCULAR | Status: DC | PRN

## 2022-02-22 MED ORDER — PHENYLEPHRINE 80 MCG/ML (10ML) SYRINGE FOR IV PUSH (FOR BLOOD PRESSURE SUPPORT): 80.0000 ug | PREFILLED_SYRINGE | INTRAVENOUS | Status: DC | PRN

## 2022-02-22 MED ORDER — OXYCODONE-ACETAMINOPHEN 5-325 MG PO TABS: 1.0000 | ORAL_TABLET | ORAL | Status: DC | PRN

## 2022-02-22 MED ORDER — SENNOSIDES-DOCUSATE SODIUM 8.6-50 MG PO TABS
2.0000 | ORAL_TABLET | Freq: Every day | ORAL | Status: DC
  Administered 2022-02-23: 2 via ORAL
  Filled 2022-02-22: qty 2

## 2022-02-22 MED ORDER — OXYTOCIN BOLUS FROM INFUSION
333.0000 mL | Freq: Once | INTRAVENOUS | Status: AC
  Administered 2022-02-22: 333 mL via INTRAVENOUS

## 2022-02-22 MED ORDER — FLEET ENEMA 7-19 GM/118ML RE ENEM: 1.0000 | ENEMA | RECTAL | Status: DC | PRN

## 2022-02-22 MED ORDER — ACETAMINOPHEN 325 MG PO TABS
650.0000 mg | ORAL_TABLET | ORAL | Status: DC | PRN
  Administered 2022-02-23: 650 mg via ORAL
  Filled 2022-02-22: qty 2

## 2022-02-22 MED ORDER — ONDANSETRON HCL 4 MG/2ML IJ SOLN: 4.0000 mg | Freq: Four times a day (QID) | INTRAMUSCULAR | Status: DC | PRN

## 2022-02-22 MED ORDER — OXYCODONE HCL 5 MG PO TABS: 10.0000 mg | ORAL_TABLET | ORAL | Status: DC | PRN

## 2022-02-22 MED ORDER — ONDANSETRON HCL 4 MG PO TABS: 4.0000 mg | ORAL_TABLET | ORAL | Status: DC | PRN

## 2022-02-22 MED ORDER — DIBUCAINE (PERIANAL) 1 % EX OINT: 1.0000 | TOPICAL_OINTMENT | CUTANEOUS | Status: DC | PRN

## 2022-02-22 MED ORDER — PRENATAL MULTIVITAMIN CH
1.0000 | ORAL_TABLET | Freq: Every day | ORAL | Status: DC
  Administered 2022-02-23: 1 via ORAL
  Filled 2022-02-22: qty 1

## 2022-02-22 MED ORDER — TETANUS-DIPHTH-ACELL PERTUSSIS 5-2.5-18.5 LF-MCG/0.5 IM SUSY: 0.5000 mL | PREFILLED_SYRINGE | Freq: Once | INTRAMUSCULAR | Status: DC

## 2022-02-22 MED ORDER — DIPHENHYDRAMINE HCL 25 MG PO CAPS: 25.0000 mg | ORAL_CAPSULE | Freq: Four times a day (QID) | ORAL | Status: DC | PRN

## 2022-02-22 MED ORDER — ACETAMINOPHEN 325 MG PO TABS: 650.0000 mg | ORAL_TABLET | ORAL | Status: DC | PRN

## 2022-02-22 MED ORDER — LACTATED RINGERS IV SOLN: 500.0000 mL | INTRAVENOUS | Status: DC | PRN

## 2022-02-22 MED ORDER — LIDOCAINE HCL (PF) 1 % IJ SOLN
INTRAMUSCULAR | Status: DC | PRN
  Administered 2022-02-22 (×2): 4 mL via EPIDURAL

## 2022-02-22 MED ORDER — COCONUT OIL OIL: 1.0000 | TOPICAL_OIL | Status: DC | PRN

## 2022-02-22 MED ORDER — OXYCODONE-ACETAMINOPHEN 5-325 MG PO TABS: 2.0000 | ORAL_TABLET | ORAL | Status: DC | PRN

## 2022-02-22 MED ORDER — IBUPROFEN 600 MG PO TABS
600.0000 mg | ORAL_TABLET | Freq: Four times a day (QID) | ORAL | Status: DC
  Administered 2022-02-22 – 2022-02-23 (×4): 600 mg via ORAL
  Filled 2022-02-22 (×4): qty 1

## 2022-02-22 MED ORDER — SIMETHICONE 80 MG PO CHEW: 80.0000 mg | CHEWABLE_TABLET | ORAL | Status: DC | PRN

## 2022-02-22 MED ORDER — LACTATED RINGERS IV SOLN
500.0000 mL | Freq: Once | INTRAVENOUS | Status: AC
  Administered 2022-02-22: 500 mL via INTRAVENOUS

## 2022-02-22 MED ORDER — WITCH HAZEL-GLYCERIN EX PADS: 1.0000 | MEDICATED_PAD | CUTANEOUS | Status: DC | PRN

## 2022-02-22 MED ORDER — LACTATED RINGERS IV SOLN: INTRAVENOUS | Status: DC

## 2022-02-22 NOTE — Lactation Note (Signed)
This note was copied from a baby's chart. Lactation Consultation Note  Patient Name: Laura Schmidt [id number] Date: 02/22/2022 Reason for consult: Initial assessment;Term Age:27 hours Experienced BF mom stated BF going well having no issues or questions.  Mom encouraged to feed baby 8-12 times/24 hours and with feeding cues.  Encouraged mom to BF STS and BF before giving formula. Mom is BF/formula. Encouraged to call for assistance as needed.  Maternal Data Does the patient have breastfeeding experience prior to this delivery?: Yes How long did the patient breastfeed?: 6  months  Feeding    LATCH Score                    Lactation Tools Discussed/Used    Interventions Interventions: [facility] brochure  Discharge    Consult Status Consult Status: Follow-up Date: 02/23/22    Charyl Dancer 02/22/2022, 8:22 PM

## 2022-02-22 NOTE — Lactation Note (Signed)
This note was copied from a baby's chart. Lactation Consultation Note  Patient Name: Laura Schmidt [id number] Date: 02/22/2022 Reason for consult: L&D Initial assessment;Term;Breastfeeding assistance;Mother's request (LC called Antonietta Barcelona LD/RN and she asked mom if she would like to be seen by [facility] , and she said yes. Baby STS, rooting when LC entered the room. LC offered to assist, mom receptive. Baby opened wide, attempted at 1st and then latched for 3 mins/ off) , experienced BF  Age:77 mins PP  Parents aware they will be seen on the MBU  Maternal Data Has patient been taught Hand Expression?:  (several drops .) Does the patient have breastfeeding experience prior to this delivery?: Yes How long did the patient breastfeed?: per mom 5 months with her 1st baby  Feeding Mother's Current Feeding Choice: Breast Milk and Formula  LATCH Score Latch: Repeated attempts needed to sustain latch, nipple held in mouth throughout feeding, stimulation needed to elicit sucking reflex.  Audible Swallowing: A few with stimulation  Type of Nipple: Everted at rest and after stimulation  Comfort (Breast/Nipple): Soft / non-tender  Hold (Positioning): Full assist, staff holds infant at breast  LATCH Score: 6   Lactation Tools Discussed/Used    Interventions Interventions: Breast feeding basics reviewed;Assisted with latch;Skin to skin;Hand express;Adjust position;Education  Discharge    Consult Status Consult Status: Follow-up from L&D Date: 02/22/22 Follow-up type: In-patient    [name] 02/22/2022, 11:49 AM

## 2022-02-22 NOTE — H&P (Addendum)
Laura Schmidt is a 30 y.I.D7O2423 female presenting for SROM at 40 4/[redacted]wks gestation Pt is dated per [facility] [place]. Her prenatal care has been uncomplicated. Her last delivery was in 2020- svd , uncomplicated  She is GBS neg. AFP was wnl . OB History     Gravida  4   Para  1   Term  1   Preterm      AB  2   Living  1      SAB  2   IAB      Ectopic      Multiple  0   Live Births  1          Past Medical History:  Diagnosis Date   Anemia    Past Surgical History:  Procedure Laterality Date   NO PAST SURGERIES     Family History: family history includes Diabetes in her mother. Social History:  reports that she has never smoked. She has never used smokeless tobacco. She reports that she does not drink alcohol and does not use drugs.     Maternal Diabetes: No Genetic Screening: Normal Maternal Ultrasounds/Referrals: Normal Fetal Ultrasounds or other Referrals:  None Maternal Substance Abuse:  No Significant Maternal Medications:  None Significant Maternal Lab Results:  Group B Strep negative Number of Prenatal Visits:greater than 3 verified prenatal visits Other Comments:  None  Review of Systems  Constitutional:  Positive for activity change. Negative for fatigue.  Eyes:  Negative for photophobia and visual disturbance.  Respiratory:  Negative for chest tightness and shortness of breath.   Cardiovascular:  Positive for leg swelling. Negative for chest pain and palpitations.  Gastrointestinal:  Positive for abdominal pain.  Genitourinary:  Positive for pelvic pain.  Musculoskeletal:  Negative for back pain.  Neurological:  Negative for light-headedness and headaches.  Psychiatric/Behavioral:  The patient is not nervous/anxious.    Maternal Medical History:  Reason for admission: Rupture of membranes and contractions.   Contractions: Onset was 3-5 hours ago.   Frequency: regular.   Perceived severity is moderate.   Fetal activity: Perceived fetal  activity is decreased.   Prenatal complications: no prenatal complications Prenatal Complications - Diabetes: none.   Dilation: 3 Effacement (%): 50 Station: -3 Exam by:: [employer], RN Blood pressure 109/69, pulse (!) 108, temperature 98.3 F (36.8 C), temperature source Oral, resp. rate 18, height 5\' 3"  (1.6 m), weight 81.6 kg, SpO2 99 %, unknown if currently breastfeeding. Maternal Exam:  Uterine Assessment: Contraction strength is moderate.  Contraction frequency is regular.  Abdomen: Patient reports generalized tenderness.  Estimated fetal weight is AGA.   Fetal presentation: vertex Introitus: Normal vulva. Vulva is negative for condylomata and lesion.  Normal vagina.  Vagina is negative for condylomata.  Amniotic fluid character: clear. Pelvis: adequate for delivery.   Cervix: Cervix evaluated by digital exam.     Fetal Exam Fetal Monitor Review: Baseline rate: 145.  Variability: moderate (6-25 bpm).   Pattern: accelerations present and no decelerations.   Fetal State Assessment: Category I - tracings are normal.   Physical Exam Vitals and nursing note reviewed. Exam conducted with a chaperone present.  Constitutional:      Appearance: Normal appearance. She is normal weight.  Cardiovascular:     Pulses: Normal pulses.  Pulmonary:     Effort: Pulmonary effort is normal.  Abdominal:     Tenderness: There is generalized abdominal tenderness.  Genitourinary:    General: Normal vulva.  Vulva is no lesion.  Musculoskeletal:        General: Normal range of motion.     Cervical back: Normal range of motion.  Skin:    General: Skin is dry.     Capillary Refill: Capillary refill takes 2 to 3 seconds.  Neurological:     General: No focal deficit present.     Mental Status: She is alert and oriented to person, place, and time. Mental status is at baseline.  Psychiatric:        Mood and Affect: Mood normal.        Behavior: Behavior normal.        Thought  Content: Thought content normal.        Judgment: Judgment normal.     Prenatal labs: ABO, Rh: --/--/O POS (12/15 0334) Antibody: NEG (12/15 0334) Rubella: Immune (05/19 0000) RPR: Nonreactive (05/19 0000)  HBsAg: Negative (05/19 0000)  HIV: Non-reactive (05/19 0000)  GBS: Negative/-- (11/21 0000)   Assessment/Plan: [id number] female at 34 4/[redacted]wks gestation with SROM and in active labor, now comfortable with epidural -Admitted - SVE now 4.5cm dil/80%/-2 ( was 3cm on arrival -Augment with pitocin if indicated -GBS neg -Anticipate svd   Venetia Night Jone Panebianco 02/22/2022, 5:54 AM

## 2022-02-22 NOTE — Anesthesia Preprocedure Evaluation (Signed)
Anesthesia Evaluation  Patient identified by MRN, date of birth, ID band Patient awake    Reviewed: Allergy & Precautions, Patient's Chart, lab work & pertinent test results  History of Anesthesia Complications Negative for: history of anesthetic complications  Airway Mallampati: II  TM Distance: >3 FB Neck ROM: Full    Dental no notable dental hx.    Pulmonary neg pulmonary ROS,    Pulmonary exam normal        Cardiovascular negative cardio ROS Normal cardiovascular exam     Neuro/Psych negative neurological ROS  negative psych ROS   GI/Hepatic negative GI ROS, Neg liver ROS,   Endo/Other  negative endocrine ROS  Renal/GU negative Renal ROS  negative genitourinary   Musculoskeletal negative musculoskeletal ROS (+)   Abdominal   Peds  Hematology negative hematology ROS (+)   Anesthesia Other Findings Day of surgery medications reviewed with patient.  Reproductive/Obstetrics (+) Pregnancy                             Anesthesia Physical Anesthesia Plan  ASA: 2  Anesthesia Plan: Epidural   Post-op Pain Management:    Induction:   PONV Risk Score and Plan: Treatment may vary due to age or medical condition  Airway Management Planned: Natural Airway  Additional Equipment: Fetal Monitoring  Intra-op Plan:   Post-operative Plan:   Informed Consent: I have reviewed the patients History and Physical, chart, labs and discussed the procedure including the risks, benefits and alternatives for the proposed anesthesia with the patient or authorized representative who has indicated his/her understanding and acceptance.       Plan Discussed with:   Anesthesia Plan Comments:         Anesthesia Quick Evaluation  

## 2022-02-22 NOTE — Anesthesia Procedure Notes (Signed)
Epidural Patient location during procedure: OB Start time: 02/22/2022 5:28 AM End time: 02/22/2022 5:33 AM  Staffing Anesthesiologist: Kaylyn Layer, MD Performed: anesthesiologist   Preanesthetic Checklist Completed: patient identified, IV checked, risks and benefits discussed, monitors and equipment checked, pre-op evaluation and timeout performed  Epidural Patient position: sitting Prep: DuraPrep and site prepped and draped Patient monitoring: continuous pulse ox, blood pressure and heart rate Approach: midline Location: L3-L4 Injection technique: LOR air  Needle:  Needle type: Tuohy  Needle gauge: 17 G Needle length: 9 cm Needle insertion depth: 5 cm Catheter type: closed end flexible Catheter size: 19 Gauge Catheter at skin depth: 10 cm Test dose: negative and Other (1% lidocaine)  Assessment Events: blood not aspirated, no cerebrospinal fluid, injection not painful, no injection resistance, no paresthesia and negative IV test  Additional Notes Patient identified. Risks, benefits, and alternatives discussed with patient including but not limited to bleeding, infection, nerve damage, paralysis, failed block, incomplete pain control, headache, blood pressure changes, nausea, vomiting, reactions to medication, itching, and postpartum back pain. Confirmed with bedside nurse the patient's most recent platelet count. Confirmed with patient that they are not currently taking any anticoagulation, have any bleeding history, or any family history of bleeding disorders. Patient expressed understanding and wished to proceed. All questions were answered. Sterile technique was used throughout the entire procedure. Please see nursing notes for vital signs.   Crisp LOR after 2 attempts. Test dose was given through epidural catheter and negative prior to continuing to dose epidural or start infusion. Warning signs of high block given to the patient including shortness of breath,  tingling/numbness in hands, complete motor block, or any concerning symptoms with instructions to call for help. Patient was given instructions on fall risk and not to get out of bed. All questions and concerns addressed with instructions to call with any issues or inadequate analgesia.  Reason for block:procedure for pain

## 2022-02-22 NOTE — MAU Note (Signed)
..  Miria Kroboth is a 26 y.o. at [redacted]w[redacted]d here in MAU reporting: SROM at 0130 and contractions every 15 minutes. Denies vaginal bleeding. +FM.  Pain score: 6/10 Vitals:   02/22/22 0301  BP: 127/78  Pulse: (!) 109  Resp: 18  Temp: 98.7 F (37.1 C)  SpO2: 98%

## 2022-02-23 LAB — CBC
HCT: 31.8 % — ABNORMAL LOW (ref 36.0–46.0)
Hemoglobin: 10.7 g/dL — ABNORMAL LOW (ref 12.0–15.0)
MCH: 28 pg (ref 26.0–34.0)
MCHC: 33.6 g/dL (ref 30.0–36.0)
MCV: 83.2 fL (ref 80.0–100.0)
Platelets: 174 10*3/uL (ref 150–400)
RBC: 3.82 MIL/uL — ABNORMAL LOW (ref 3.87–5.11)
RDW: 16.9 % — ABNORMAL HIGH (ref 11.5–15.5)
WBC: 10.8 10*3/uL — ABNORMAL HIGH (ref 4.0–10.5)
nRBC: 0 % (ref 0.0–0.2)

## 2022-02-23 MED ORDER — IBUPROFEN 600 MG PO TABS: 600.0000 mg | ORAL_TABLET | Freq: Four times a day (QID) | ORAL | 0 refills | Status: AC

## 2022-02-23 NOTE — Discharge Instructions (Signed)
As per discharge pamphlet °

## 2022-02-23 NOTE — Lactation Note (Signed)
This note was copied from a baby's chart. Lactation Consultation Note  Patient Name: Laura Schmidt [id number] Date: 02/23/2022 Reason for consult: Follow-up assessment Age:27 hours  P2: Term infant at 40+4 weeks Feeding preference; Breast/formula Weight loss: 4%  Baby was swaddled and asleep in the bassinet when I arrived.  Mother had no questions/concerns related to breast feeding.  Last LATCH score was a 9; baby is voiding/stooling well.  Mother has our op phone number for any concerns after discharge.         LATCH Score                    Lactation Tools Discussed/Used    Interventions    Discharge    Consult Status Consult Status: Complete Date: 02/23/22 Follow-up type: Call as needed    Jeniffer Culliver R Katarina Riebe 02/23/2022, 1:17 PM

## 2022-02-23 NOTE — Progress Notes (Signed)
PPD #1 No problems, wants to go home today Afeb, VSS Fundus firm, NT at U-1 Continue routine postpartum care, d/c home 

## 2022-02-23 NOTE — Discharge Summary (Signed)
Postpartum Discharge Summary      Patient Name: Laura Schmidt DOB: 1996/01/22 MRN: [id number]  Date of admission: 02/22/2022 Delivery date:02/22/2022  Delivering provider: Philip Aspen  Date of discharge: 02/23/2022  Admitting diagnosis: Normal labor [O80, Z37.9] Intrauterine pregnancy: [redacted]w[redacted]d     Secondary diagnosis:  Principal Problem:   Normal labor    Discharge diagnosis: Term Pregnancy Delivered                                               Hospital course: Onset of Labor With Vaginal Delivery      26 y.o. yo [id number] at [redacted]w[redacted]d was admitted in Latent Labor on 02/22/2022. Labor course was complicated by nothing  Membrane Rupture Time/Date: 1:30 AM ,02/22/2022   Delivery Method:Vaginal, Spontaneous  Episiotomy: None  Lacerations:  2nd degree;Perineal  Patient had a postpartum course complicated by nothing.  She is ambulating, tolerating a regular diet, passing flatus, and urinating well. Patient is discharged home in stable condition on 02/23/22.  Newborn Data: Birth date:02/22/2022  Birth time:10:47 AM  Gender:Female  Living status:Living  Apgars:9 ,9  Weight:4400 g   Physical exam  Vitals:   02/22/22 1509 02/22/22 1950 02/23/22 0004 02/23/22 0510  BP: 119/76 119/84 107/75 120/80  Pulse: (!) 102 85 79 79  Resp: 18 18 18 18   Temp: 98.4 F (36.9 C) 98.6 F (37 C) 98.5 F (36.9 C) 98.5 F (36.9 C)  TempSrc:  Oral Oral Oral  SpO2: 98%     Weight:      Height:       General: alert Lochia: appropriate Uterine Fundus: firm  Labs: Lab Results  Component Value Date   WBC 10.8 (H) 02/23/2022   HGB 10.7 (L) 02/23/2022   HCT 31.8 (L) 02/23/2022   MCV 83.2 02/23/2022   PLT 174 02/23/2022      Latest Ref Rng & Units 01/24/2019    6:48 AM  CMP  Glucose 70 - 99 mg/dL 92   BUN 6 - 20 mg/dL 7   Creatinine 01/26/2019 - 2.09 mg/dL 4.70   Sodium 9.62 - 836 mmol/L 137   Potassium 3.5 - 5.1 mmol/L 3.5   Chloride 98 - 111 mmol/L 103   CO2 22 - 32 mmol/L 21    Calcium 8.9 - 10.3 mg/dL 8.5   Total Protein 6.5 - 8.1 g/dL 5.9   Total Bilirubin 0.3 - 1.2 mg/dL 0.5   Alkaline Phos 38 - 126 U/L 80   AST 15 - 41 U/L 18   ALT 0 - 44 U/L 11    Edinburgh Score:    02/09/2019    3:23 PM  Edinburgh Postnatal Depression Scale Screening Tool  I have been able to laugh and see the funny side of things. 0  I have looked forward with enjoyment to things. 0  I have blamed myself unnecessarily when things went wrong. 0  I have been anxious or worried for no good reason. 0  I have felt scared or panicky for no good reason. 0  Things have been getting on top of me. 0  I have been so unhappy that I have had difficulty sleeping. 0  I have felt sad or miserable. 0  I have been so unhappy that I have been crying. 0  The thought of harming myself has occurred to me. 0  14/03/2018  Postnatal Depression Scale Total 0      After visit meds:  Allergies as of 02/23/2022   No Known Allergies      Medication List     TAKE these medications    ferrous sulfate 325 (65 FE) MG tablet Take 325 mg by mouth daily with breakfast.   ibuprofen 600 MG tablet Commonly known as: ADVIL Take 1 tablet (600 mg total) by mouth every 6 (six) hours.   PRENATAL VITAMINS PO Take 1 tablet by mouth daily.         Discharge home in stable condition Infant Feeding: Breast Infant Disposition:home with mother Discharge instruction: per After Visit Summary and Postpartum booklet. Activity: Advance as tolerated. Pelvic rest for 6 weeks.  Diet: routine diet Postpartum Appointment:4 weeks Follow up Visit:  [place], [place], DO. Schedule an appointment as soon as possible for a visit in 4 week(s).   Specialty: Obstetrics and Gynecology Contact information: [address] [place] [address] [phone]                     02/23/2022 Clarene Duke, MD

## 2022-02-23 NOTE — Anesthesia Postprocedure Evaluation (Signed)
Anesthesia Post Note  Patient: Laura Schmidt  Procedure(s) Performed: AN AD HOC LABOR EPIDURAL     Patient location during evaluation: Mother Baby Anesthesia Type: Epidural Level of consciousness: awake and alert Pain management: pain level controlled Vital Signs Assessment: post-procedure vital signs reviewed and stable Respiratory status: spontaneous breathing, nonlabored ventilation and respiratory function stable Cardiovascular status: stable Postop Assessment: no headache, no backache and epidural receding Anesthetic complications: no   No notable events documented.  Last Vitals:  Vitals:   02/23/22 0004 02/23/22 0510  BP: 107/75 120/80  Pulse: 79 79  Resp: 18 18  Temp: 36.9 C 36.9 C  SpO2:      Last Pain:  Vitals:   02/23/22 0510  TempSrc: Oral  PainSc: 0-No pain   Pain Goal: Patients Stated Pain Goal: 0 (02/22/22 0308)                 [facility] Records

## 2022-02-27 ENCOUNTER — Telehealth: Payer: Self-pay | Admitting: *Deleted

## 2022-02-27 NOTE — Patient Outreach (Signed)
  Care Coordination [facility] Note Transition Care Management Follow-up Telephone Call Date of discharge and from where: 02/23/22 from [facility] How have you been since you were released from the hospital? "Everything is good" Any questions or concerns? No  Items Reviewed: Did the pt receive and understand the discharge instructions provided? Yes  Medications obtained and verified? Yes  Other? No  Any new allergies since your discharge? No  Dietary orders reviewed? Yes Do you have support at home? Yes   Home Care and Equipment/Supplies: Were home health services ordered? no If so, what is the name of the agency? N/A  Has the agency set up a time to come to the patient's home? not applicable Were any new equipment or medical supplies ordered?  No What is the name of the medical supply agency? N/A Were you able to get the supplies/equipment? not applicable Do you have any questions related to the use of the equipment or supplies? No  Functional Questionnaire: (I = Independent and D = Dependent) ADLs: I  Bathing/Dressing- I  Meal Prep- I  Eating- I  Maintaining continence- I  Transferring/Ambulation- I  Managing Meds- I  Follow up appointments reviewed:  PCP Hospital f/u appt confirmed?  Patient thought she was to call the office in 4 weeks. She will call to schedule her postpartum follow up visit today  . Specialist Hospital f/u appt confirmed? No  . Are transportation arrangements needed? No  If their condition worsens, is the pt aware to call PCP or go to the Emergency Dept.? Yes Was the patient provided with contact information for the PCP's office or ED? No Was to pt encouraged to call back with questions or concerns? Yes   Estanislado Emms RN, BSN [place]  [employer]

## 2022-03-06 ENCOUNTER — Telehealth (HOSPITAL_COMMUNITY): Payer: Self-pay | Admitting: *Deleted

## 2022-03-06 NOTE — Telephone Encounter (Signed)
Left phone voicemail message.  Duffy Rhody, RN 03-06-2022 at 10:03am

## 2022-03-11 DIAGNOSIS — Z419 Encounter for procedure for purposes other than remedying health state, unspecified: Secondary | ICD-10-CM | POA: Diagnosis not present

## 2022-04-10 DIAGNOSIS — Z3202 Encounter for pregnancy test, result negative: Secondary | ICD-10-CM | POA: Diagnosis not present

## 2022-04-10 DIAGNOSIS — Z30430 Encounter for insertion of intrauterine contraceptive device: Secondary | ICD-10-CM | POA: Diagnosis not present

## 2022-04-11 DIAGNOSIS — Z419 Encounter for procedure for purposes other than remedying health state, unspecified: Secondary | ICD-10-CM | POA: Diagnosis not present

## 2022-05-03 DIAGNOSIS — N920 Excessive and frequent menstruation with regular cycle: Secondary | ICD-10-CM | POA: Diagnosis not present

## 2022-05-03 DIAGNOSIS — Z30431 Encounter for routine checking of intrauterine contraceptive device: Secondary | ICD-10-CM | POA: Diagnosis not present

## 2022-05-10 DIAGNOSIS — Z419 Encounter for procedure for purposes other than remedying health state, unspecified: Secondary | ICD-10-CM | POA: Diagnosis not present

## 2022-06-10 DIAGNOSIS — Z419 Encounter for procedure for purposes other than remedying health state, unspecified: Secondary | ICD-10-CM | POA: Diagnosis not present

## 2022-07-10 DIAGNOSIS — Z419 Encounter for procedure for purposes other than remedying health state, unspecified: Secondary | ICD-10-CM | POA: Diagnosis not present

## 2022-08-10 DIAGNOSIS — Z419 Encounter for procedure for purposes other than remedying health state, unspecified: Secondary | ICD-10-CM | POA: Diagnosis not present

## 2022-09-09 DIAGNOSIS — Z419 Encounter for procedure for purposes other than remedying health state, unspecified: Secondary | ICD-10-CM | POA: Diagnosis not present

## 2022-10-10 DIAGNOSIS — Z419 Encounter for procedure for purposes other than remedying health state, unspecified: Secondary | ICD-10-CM | POA: Diagnosis not present

## 2022-11-10 DIAGNOSIS — Z419 Encounter for procedure for purposes other than remedying health state, unspecified: Secondary | ICD-10-CM | POA: Diagnosis not present

## 2022-11-13 IMAGING — US US OB TRANSVAGINAL
1 series · 15 of 28 positions shown · non-contrast
Comparison: None.

CLINICAL DATA: Vaginal bleeding in 1st trimester pregnancy.

EXAM:
OBSTETRIC <14 WK US AND TRANSVAGINAL OB US
TECHNIQUE: Both transabdominal and transvaginal ultrasound examinations were
performed for complete evaluation of the gestation as well as the
maternal uterus, adnexal regions, and pelvic cul-de-sac.
Transvaginal technique was performed to assess early pregnancy.

[Series 1: us ob transvaginal · 39 acquisitions, 15 frames shown]
[im 1/39]
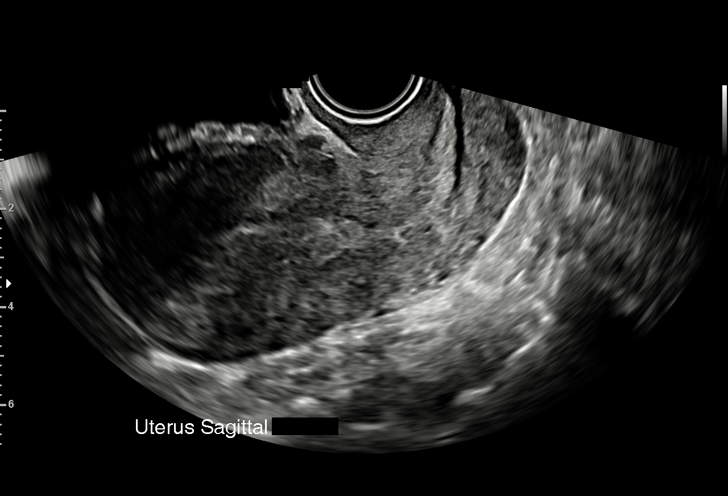
[im 3/39]
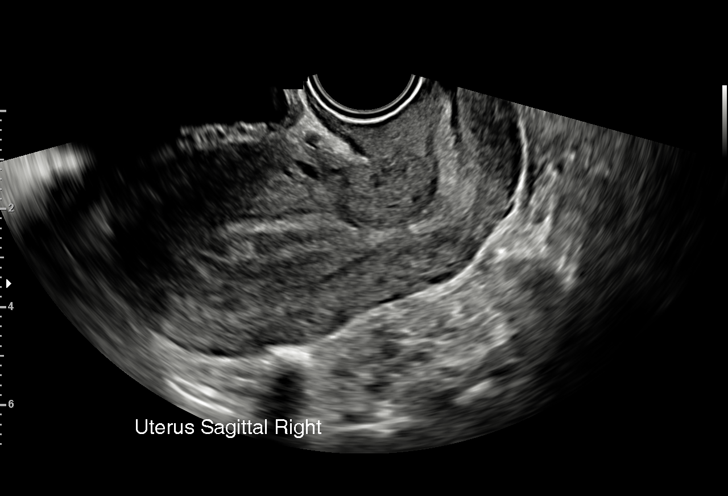
[im 6/39]
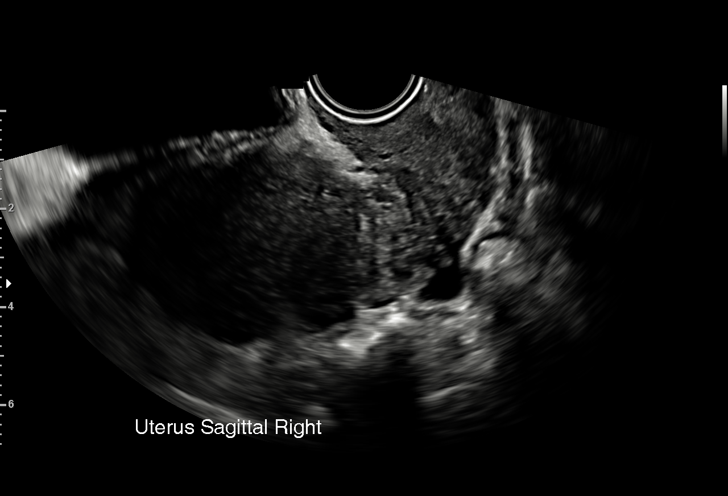
[im 9/39]
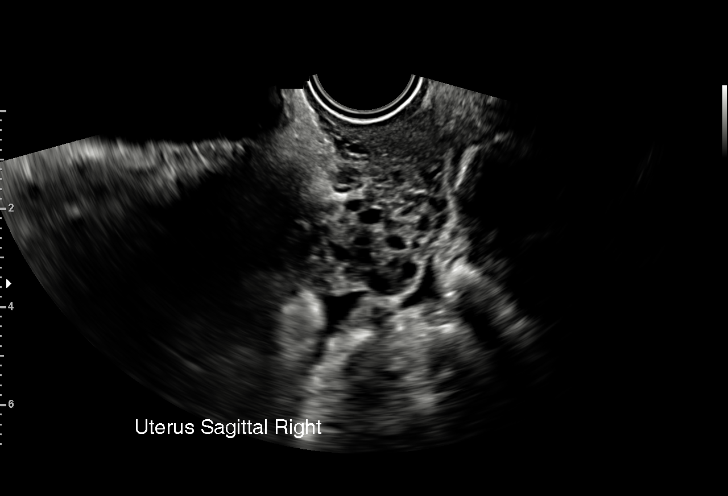
[im 12/39]
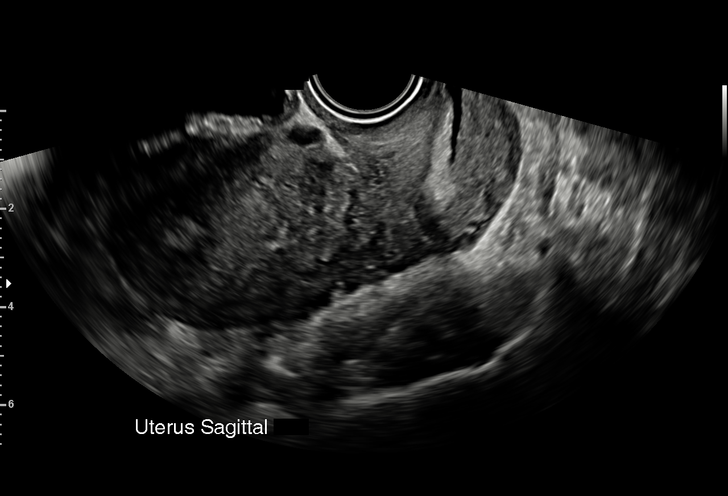
[im 15/39]
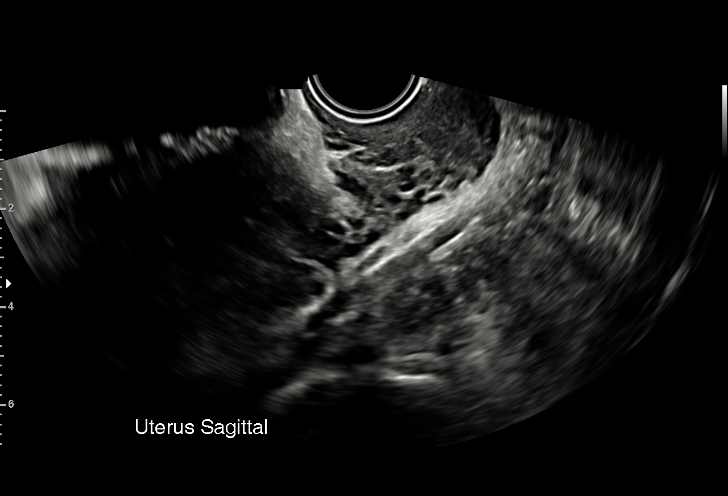
[im 17/39]
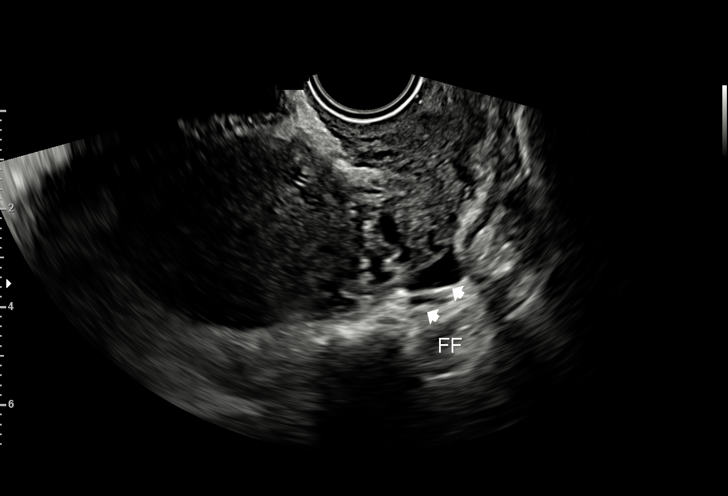
[im 20/39]
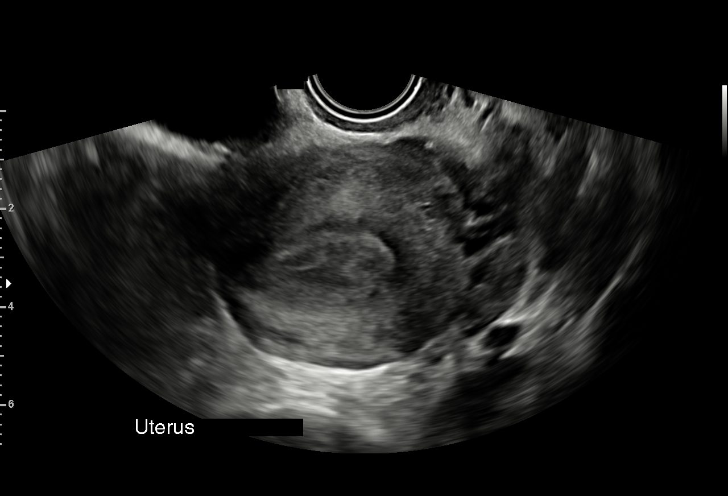
[im 22/39]
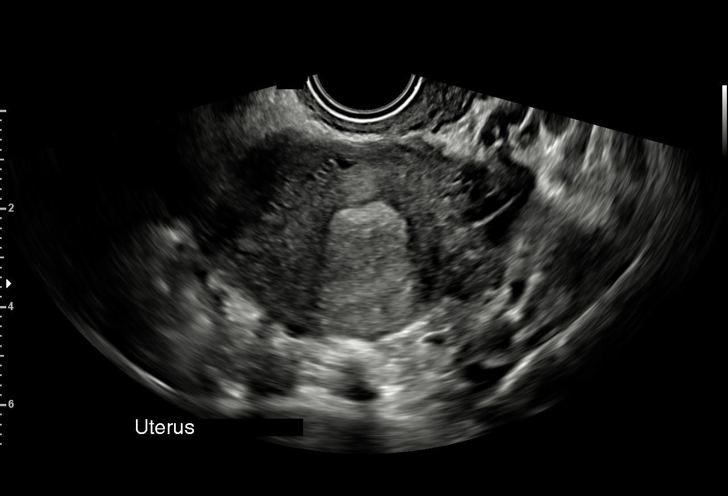
[im 24/39]
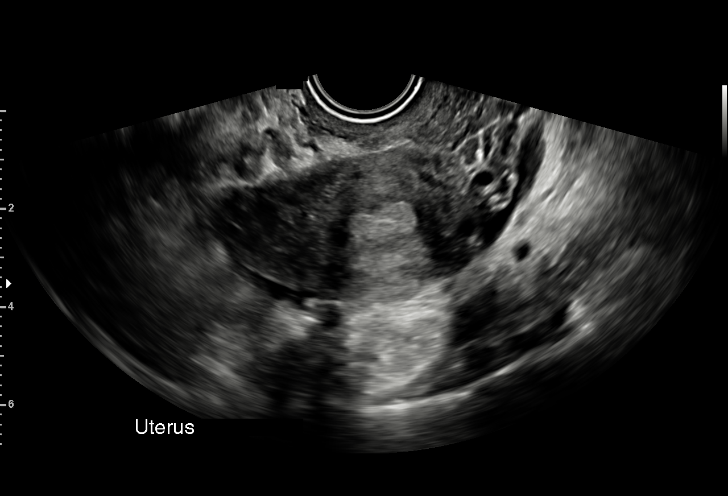
[im 27/39]
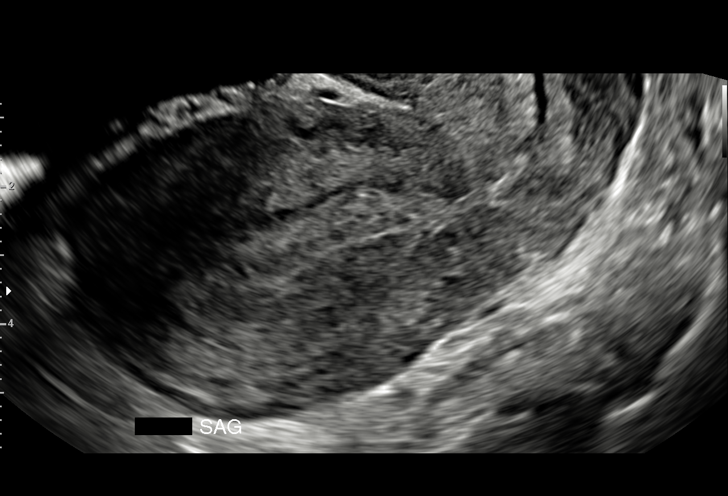
[im 30/39]
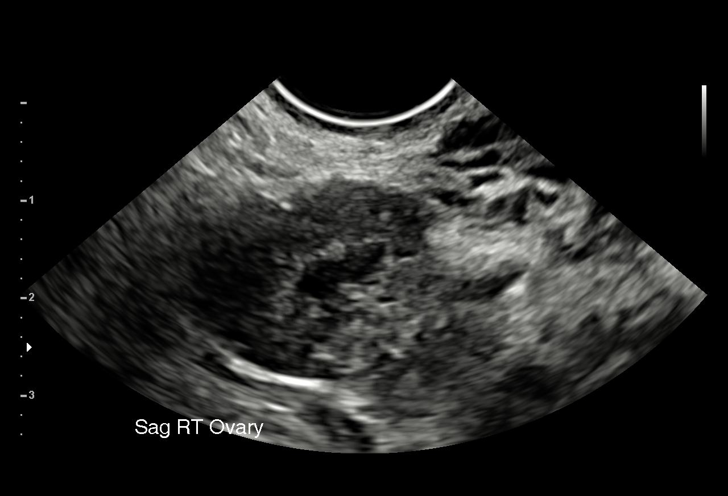
[im 33/39]
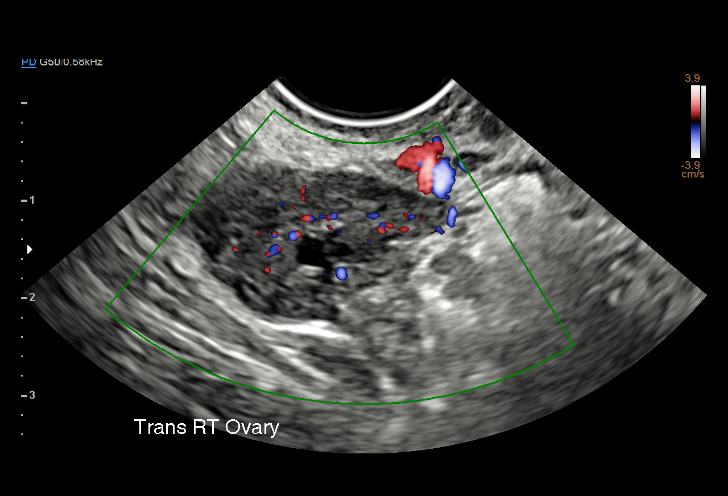
[im 36/39]
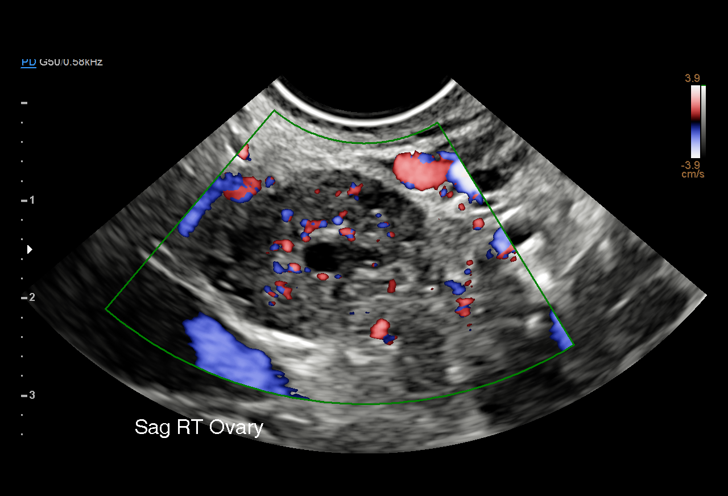
[im 39/39]
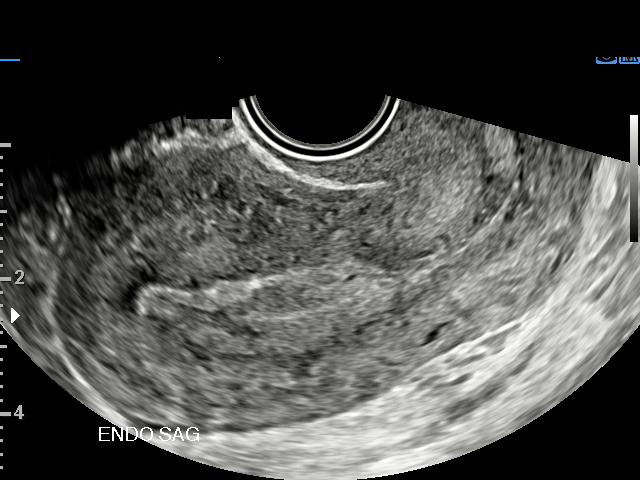

[15 of 28 positions shown; findings below may reference images not displayed]

FINDINGS: Intrauterine gestational sac: None

Maternal uterus/adnexae: Endometrial thickness measures 14 mm. No
fibroids identified. Both ovaries are normal in appearance. No mass
or abnormal free fluid identified.
IMPRESSION: Pregnancy of unknown anatomic location (no intrauterine gestational
sac or adnexal mass identified). Differential diagnosis includes
recent spontaneous abortion, IUP too early to visualize, and
non-visualized ectopic pregnancy. Recommend correlation with serial
beta-hCG levels, and follow up US if warranted clinically.

## 2022-12-10 DIAGNOSIS — Z419 Encounter for procedure for purposes other than remedying health state, unspecified: Secondary | ICD-10-CM | POA: Diagnosis not present

## 2023-01-10 DIAGNOSIS — Z419 Encounter for procedure for purposes other than remedying health state, unspecified: Secondary | ICD-10-CM | POA: Diagnosis not present

## 2023-02-09 DIAGNOSIS — Z419 Encounter for procedure for purposes other than remedying health state, unspecified: Secondary | ICD-10-CM | POA: Diagnosis not present

## 2023-03-12 DIAGNOSIS — Z419 Encounter for procedure for purposes other than remedying health state, unspecified: Secondary | ICD-10-CM | POA: Diagnosis not present

## 2023-03-28 DIAGNOSIS — Z0000 Encounter for general adult medical examination without abnormal findings: Secondary | ICD-10-CM | POA: Diagnosis not present

## 2023-03-28 DIAGNOSIS — Z30432 Encounter for removal of intrauterine contraceptive device: Secondary | ICD-10-CM | POA: Diagnosis not present

## 2023-04-12 DIAGNOSIS — Z419 Encounter for procedure for purposes other than remedying health state, unspecified: Secondary | ICD-10-CM | POA: Diagnosis not present

## 2023-05-10 DIAGNOSIS — Z419 Encounter for procedure for purposes other than remedying health state, unspecified: Secondary | ICD-10-CM | POA: Diagnosis not present

## 2023-06-21 DIAGNOSIS — Z419 Encounter for procedure for purposes other than remedying health state, unspecified: Secondary | ICD-10-CM | POA: Diagnosis not present

## 2023-07-21 DIAGNOSIS — Z419 Encounter for procedure for purposes other than remedying health state, unspecified: Secondary | ICD-10-CM | POA: Diagnosis not present

## 2023-08-21 DIAGNOSIS — Z419 Encounter for procedure for purposes other than remedying health state, unspecified: Secondary | ICD-10-CM | POA: Diagnosis not present

## 2023-09-20 DIAGNOSIS — Z419 Encounter for procedure for purposes other than remedying health state, unspecified: Secondary | ICD-10-CM | POA: Diagnosis not present

## 2023-10-21 DIAGNOSIS — Z419 Encounter for procedure for purposes other than remedying health state, unspecified: Secondary | ICD-10-CM | POA: Diagnosis not present

## 2023-10-23 ENCOUNTER — Ambulatory Visit

## 2023-10-23 DIAGNOSIS — Z3201 Encounter for pregnancy test, result positive: Secondary | ICD-10-CM

## 2023-10-23 DIAGNOSIS — Z3200 Encounter for pregnancy test, result unknown: Secondary | ICD-10-CM

## 2023-10-23 LAB — POCT URINE PREGNANCY: Preg Test, Ur: POSITIVE — AB

## 2023-10-23 NOTE — Progress Notes (Signed)
 Pt left urine sample and checked out before being seen for appt today. UPT is positive. Pt is scheduled for intake on 11/17/23

## 2023-11-17 ENCOUNTER — Encounter

## 2023-11-21 DIAGNOSIS — Z419 Encounter for procedure for purposes other than remedying health state, unspecified: Secondary | ICD-10-CM | POA: Diagnosis not present

## 2023-11-26 ENCOUNTER — Ambulatory Visit (INDEPENDENT_AMBULATORY_CARE_PROVIDER_SITE_OTHER): Admitting: *Deleted

## 2023-11-26 ENCOUNTER — Other Ambulatory Visit (INDEPENDENT_AMBULATORY_CARE_PROVIDER_SITE_OTHER): Payer: Self-pay

## 2023-11-26 VITALS — BP 123/80 | HR 98 | Wt 153.0 lb

## 2023-11-26 DIAGNOSIS — Z3481 Encounter for supervision of other normal pregnancy, first trimester: Secondary | ICD-10-CM

## 2023-11-26 DIAGNOSIS — O3680X0 Pregnancy with inconclusive fetal viability, not applicable or unspecified: Secondary | ICD-10-CM

## 2023-11-26 DIAGNOSIS — Z3A10 10 weeks gestation of pregnancy: Secondary | ICD-10-CM

## 2023-11-26 DIAGNOSIS — Z3480 Encounter for supervision of other normal pregnancy, unspecified trimester: Secondary | ICD-10-CM

## 2023-11-26 NOTE — Progress Notes (Signed)
 New OB Intake  I connected with Laura Schmidt  on 11/26/23 at 10:15 AM EDT by In Person Visit and verified that I am speaking with the correct person using two identifiers. Nurse is located at [facility] and pt is located at [place].  I discussed the limitations, risks, security and privacy concerns of performing an evaluation and management service by telephone and the availability of in person appointments. I also discussed with the patient that there may be a patient responsible charge related to this service. The patient expressed understanding and agreed to proceed.  I explained I am completing New OB Intake today. We discussed EDD of 06/23/24 based on LMP of 09/17/23. Pt is [id number]. I reviewed her allergies, medications and Medical/Surgical/OB history.    Patient Active Problem List   Diagnosis Date Noted   Supervision of other normal pregnancy, antepartum 11/26/2023   Normal labor 02/22/2022   Type 3a perineal laceration 02/09/2019   Language barrier 08/06/2018     Concerns addressed today  Delivery Plans Plans to deliver at [facility]. Discussed the nature of our practice with multiple providers including residents and students as well as female and female providers. Due to the size of the practice, the delivering provider may not be the same as those providing prenatal care.   Patient is not interested in water birth.  MyChart/Babyscripts MyChart access verified. I explained pt will have some visits in office and some virtually. Babyscripts instructions given and order placed. Patient verifies receipt of registration text/e-mail. Account successfully created and app downloaded. If patient is a candidate for Optimized scheduling, add to sticky note.   Blood Pressure Cuff/Weight Scale Blood pressure cuff ordered for patient to pick-up from [employer]. Explained after first prenatal appt pt will check weekly and document in Babyscripts. Patient does not have weight scale; patient  may purchase if they desire to track weight weekly in Babyscripts.  Anatomy US  Explained first scheduled US  will be around 19 weeks. Anatomy US  scheduled for TBD at TBD.  Is patient a candidate for Babyscripts Optimization? No, due to [employer]   First visit review I reviewed new OB appt with patient. Explained pt will be seen by Laura Daring, NP at first visit. Discussed Laura Schmidt genetic screening with patient. Requests [employer].. Routine prenatal labs OB Urine collected at today's visit. Initial OB labs deferred to New OB appt.   Last Pap Diagnosis  Date Value Ref Range Status  08/25/2018   Final   NEGATIVE FOR INTRAEPITHELIAL LESIONS OR MALIGNANCY.    Laura [name], RN 11/26/2023  10:59 AM

## 2023-11-26 NOTE — Patient Instructions (Signed)
[facility] has a partnership with the [employer] to provide prenatal navigation for the most needed resources in our community. In order to see how we can help connect you to these resources we need consent to contact you. Please complete the very short consent using the link below:   English Link: [email or web address]  Spanish Link: [email or web address].[email or web address]?[email or web address]  

## 2023-11-28 ENCOUNTER — Telehealth: Payer: Self-pay

## 2023-11-30 LAB — CULTURE, OB URINE

## 2023-11-30 LAB — URINE CULTURE, OB REFLEX

## 2023-12-10 ENCOUNTER — Other Ambulatory Visit (HOSPITAL_COMMUNITY)
Admission: RE | Admit: 2023-12-10 | Discharge: 2023-12-10 | Disposition: A | Discharge status: Home / Self Care | Source: Ambulatory Visit | Attending: Obstetrics and Gynecology | Admitting: Obstetrics and Gynecology

## 2023-12-10 ENCOUNTER — Encounter: Payer: Self-pay | Admitting: Obstetrics and Gynecology

## 2023-12-10 ENCOUNTER — Ambulatory Visit (INDEPENDENT_AMBULATORY_CARE_PROVIDER_SITE_OTHER): Admitting: Obstetrics and Gynecology

## 2023-12-10 VITALS — BP 128/72 | HR 99 | Wt 155.8 lb

## 2023-12-10 DIAGNOSIS — Z3401 Encounter for supervision of normal first pregnancy, first trimester: Secondary | ICD-10-CM | POA: Diagnosis not present

## 2023-12-10 DIAGNOSIS — Z3480 Encounter for supervision of other normal pregnancy, unspecified trimester: Secondary | ICD-10-CM

## 2023-12-10 DIAGNOSIS — Z3A12 12 weeks gestation of pregnancy: Secondary | ICD-10-CM

## 2023-12-10 NOTE — Progress Notes (Signed)
 Pt presents for NOB visit. Pt c/o pelvic pain  Pt states last a year and a half ago. No records  Requesting PAP today

## 2023-12-10 NOTE — Progress Notes (Signed)
 INITIAL PRENATAL VISIT  Subjective:   Laura Schmidt is being seen today for her first obstetrical visit.  She is at [redacted]w[redacted]d gestation by LMP Her obstetrical history is significant for none. Relationship with FOB: significant other, living together. Patient does intend to breast feed. Pregnancy history fully reviewed.  Patient reports intermittent pelvic pain , no bleeding, cramping noted.  Indications for ASA therapy (per uptodate) Two or more of the following: Nulliparity No Obesity (body mass index >30 kg/m2)  Family history of preeclampsia in mother or sister No Age >=35 years No Sociodemographic characteristics (African American race, low socioeconomic level) No Personal risk factors (eg, previous pregnancy with low birth weight or small for gestational age infant, previous adverse pregnancy outcome [eg, stillbirth], interval >10 years between pregnancies) No  Objective:    Obstetric History OB History  Gravida Para Term Preterm AB Living  5 2 2  0 2 2  SAB IAB Ectopic Multiple Live Births  2 0 0 0 2    # Outcome Date GA Lbr Len/2nd Weight Sex Type Anes PTL Lv  5 Current           4 Term 02/22/22 [redacted]w[redacted]d / 00:45 9 lb 11.2 oz (4.4 kg) M Vag-Spont EPI  LIV     Birth Comments: WNL  3 Term 02/09/19 [redacted]w[redacted]d 03:45 / 00:19 7 lb 13.2 oz (3.549 kg) F Vag-Spont EPI  LIV  2 SAB           1 SAB             Past Medical History:  Diagnosis Date   Anemia     Past Surgical History:  Procedure Laterality Date   NO PAST SURGERIES      Current Outpatient Medications on File Prior to Visit  Medication Sig Dispense Refill   Prenatal Vit-Fe Fumarate-FA (PRENATAL VITAMINS PO) Take 1 tablet by mouth daily.     ferrous sulfate 325 (65 FE) MG tablet Take 325 mg by mouth daily with breakfast. (Patient not taking: Reported on 12/10/2023)     ibuprofen  (ADVIL ) 600 MG tablet Take 1 tablet (600 mg total) by mouth every 6 (six) hours. (Patient not taking: Reported on 12/10/2023) 30 tablet 0    No current facility-administered medications on file prior to visit.    No Known Allergies  Social History:  reports that she has never smoked. She has never used smokeless tobacco. She reports that she does not drink alcohol and does not use drugs.  Family History  Problem Relation Age of Onset   Diabetes Mother     The following portions of the patient's history were reviewed and updated as appropriate: allergies, current medications, past family history, past medical history, past social history, past surgical history and problem list.  Review of Systems Review of Systems  All other systems reviewed and are negative.   Physical Exam:  BP 128/72   Pulse 99   Wt 155 lb 12.8 oz (70.7 kg)   LMP 09/17/2023 (Exact Date)   BMI 27.60 kg/m  CONSTITUTIONAL: Well-developed, well-nourished female in no acute distress.  HENT:  Normocephalic, atraumatic.   NECK: Normal range of motion SKIN: Skin is warm and dry. MUSCULOSKELETAL: Normal range of motion NEUROLOGIC: Alert and oriented  PSYCHIATRIC: Normal mood and affect. Normal behavior.  CARDIOVASCULAR: Normal heart rate noted RESPIRATORY: normal effort ABDOMEN: Soft PELVIC:deferred  Extremities:  No swelling or varicosities noted       Movement: Absent       Assessment:  Pregnancy: H4E7977  1. Supervision of other normal pregnancy, antepartum (Primary) BP normal cardiac activity and movement noted on bedside u/s and shown to patient Requesting records from [facility] for pap - Cervicovaginal ancillary only( [place]) - CBC/D/Plt+RPR+Rh+ABO+RubIgG... - HgB A1c - PANORAMA PRENATAL TEST - [facility]ON Basic Panel  2. [redacted] weeks gestation of pregnancy      Plan:     Initial labs drawn. Prenatal vitamins. Problem list reviewed and updated. Reviewed in detail the nature of the practice with collaborative care between  Genetic screening discussed: NIPS/First trimester screen/Quad/AFP ordered. Role of  ultrasound in pregnancy discussed; Anatomy US : ordered. Discussed clinic routines, schedule of care and testing, genetic screening options, involvement of students and residents under the direct supervision of APPs and doctors and presence of female providers. Pt verbalized understanding.   Future Appointments  Date Time Provider Department Center  02/11/2024  1:00 PM [facility] PROVIDER 1 [facility]  02/11/2024  1:30 PM [facility] US5 [facility]     Delores Nidia CROME, FNP

## 2023-12-11 ENCOUNTER — Ambulatory Visit: Payer: Self-pay | Admitting: Obstetrics and Gynecology

## 2023-12-11 DIAGNOSIS — Z3480 Encounter for supervision of other normal pregnancy, unspecified trimester: Secondary | ICD-10-CM

## 2023-12-11 LAB — CBC/D/PLT+RPR+RH+ABO+RUBIGG...
Antibody Screen: NEGATIVE
Basophils Absolute: 0.1 x10E3/uL (ref 0.0–0.2)
Basos: 1 %
EOS (ABSOLUTE): 0 x10E3/uL (ref 0.0–0.4)
Eos: 0 %
HCV Ab: NONREACTIVE
HIV Screen 4th Generation wRfx: NONREACTIVE
Hematocrit: 39.5 % (ref 34.0–46.6)
Hemoglobin: 13 g/dL (ref 11.1–15.9)
Hepatitis B Surface Ag: NEGATIVE
Immature Grans (Abs): 0 x10E3/uL (ref 0.0–0.1)
Immature Granulocytes: 0 %
Lymphocytes Absolute: 1.8 x10E3/uL (ref 0.7–3.1)
Lymphs: 18 %
MCH: 28.2 pg (ref 26.6–33.0)
MCHC: 32.9 g/dL (ref 31.5–35.7)
MCV: 86 fL (ref 79–97)
Monocytes Absolute: 0.7 x10E3/uL (ref 0.1–0.9)
Monocytes: 7 %
Neutrophils Absolute: 7.6 x10E3/uL — ABNORMAL HIGH (ref 1.4–7.0)
Neutrophils: 74 %
Platelets: 280 x10E3/uL (ref 150–450)
RBC: 4.61 x10E6/uL (ref 3.77–5.28)
RDW: 13.9 % (ref 11.7–15.4)
RPR Ser Ql: NONREACTIVE
Rh Factor: POSITIVE
Rubella Antibodies, IGG: 25 {index} (ref >0.99)
WBC: 10.2 x10E3/uL (ref 3.4–10.8)

## 2023-12-11 LAB — CERVICOVAGINAL ANCILLARY ONLY
Chlamydia: NEGATIVE
Comment: NEGATIVE
Comment: NEGATIVE
Comment: NORMAL
Neisseria Gonorrhea: NEGATIVE
Trichomonas: NEGATIVE

## 2023-12-11 LAB — HEMOGLOBIN A1C
Est. average glucose Bld gHb Est-mCnc: 97 mg/dL
Hgb A1c MFr Bld: 5 % (ref 4.8–5.6)

## 2023-12-11 LAB — HCV INTERPRETATION

## 2023-12-15 LAB — PANORAMA PRENATAL TEST FULL PANEL:PANORAMA TEST PLUS 5 ADDITIONAL MICRODELETIONS: FETAL FRACTION: 8

## 2023-12-17 LAB — HORIZON CUSTOM

## 2024-01-07 ENCOUNTER — Ambulatory Visit (INDEPENDENT_AMBULATORY_CARE_PROVIDER_SITE_OTHER): Admitting: Obstetrics and Gynecology

## 2024-01-07 ENCOUNTER — Other Ambulatory Visit (HOSPITAL_COMMUNITY)
Admission: RE | Admit: 2024-01-07 | Discharge: 2024-01-07 | Disposition: A | Discharge status: Home / Self Care | Source: Ambulatory Visit | Attending: Obstetrics and Gynecology | Admitting: Obstetrics and Gynecology

## 2024-01-07 ENCOUNTER — Encounter: Payer: Self-pay | Admitting: Obstetrics and Gynecology

## 2024-01-07 VITALS — BP 110/71 | HR 92 | Wt 161.2 lb

## 2024-01-07 DIAGNOSIS — Z3480 Encounter for supervision of other normal pregnancy, unspecified trimester: Secondary | ICD-10-CM | POA: Diagnosis not present

## 2024-01-07 DIAGNOSIS — R102 Pelvic and perineal pain unspecified side: Secondary | ICD-10-CM

## 2024-01-07 DIAGNOSIS — O26899 Other specified pregnancy related conditions, unspecified trimester: Secondary | ICD-10-CM | POA: Insufficient documentation

## 2024-01-07 DIAGNOSIS — N898 Other specified noninflammatory disorders of vagina: Secondary | ICD-10-CM | POA: Insufficient documentation

## 2024-01-07 DIAGNOSIS — Z3A16 16 weeks gestation of pregnancy: Secondary | ICD-10-CM

## 2024-01-07 NOTE — Progress Notes (Signed)
   PRENATAL VISIT NOTE  Subjective:  Laura Schmidt is a 28 y.o. H4E7977 at [redacted]w[redacted]d being seen today for ongoing prenatal care.  She is currently monitored for the following issues for this low-risk pregnancy and has Language barrier and Supervision of other normal pregnancy, antepartum on their problem list.  Patient reports she is experiencing increased pressure and discomfort in her pelvis and vaginal canal. Contractions: Not present. Vag. Bleeding: None.  Movement: Absent. Denies leaking of fluid.   The following portions of the patient's history were reviewed and updated as appropriate: allergies, current medications, past family history, past medical history, past social history, past surgical history and problem list.   Objective:    Vitals:   01/07/24 1016  BP: 110/71  Pulse: 92  Weight: 73.1 kg    Fetal Status:  Fetal Heart Rate (bpm): 152   Movement: Absent    General: Alert, oriented and cooperative. Patient is in no acute distress.  Skin: Skin is warm and dry. No rash noted.   Cardiovascular: Normal heart rate noted  Respiratory: Normal respiratory effort, no problems with respiration noted  Abdomen: Soft, gravid, appropriate for gestational age.  Pain/Pressure: Present     Pelvic: Sterile speculum exam. Cervix closed. Milky white thick discharge noted.  Erythematous on observation.      Extremities: Normal range of motion.  Edema: None  Mental Status: Normal mood and affect. Normal behavior. Normal judgment and thought content.   Assessment and Plan:  Pregnancy: [id number] at [redacted]w[redacted]d 1. Supervision of other normal pregnancy, antepartum (Primary) BP and FHR normal   2. [redacted] weeks gestation of pregnancy Anatomy scan 12/3 AFP today  - AFP, Serum, Open Spina Bifida  3. Vaginal discharge 4. Pelvic pressure in pregnancy Pelvic exam, visually closed and long cervix, copious d/c noted Checking swabs today  Precautions given s&s when to follow up or be seen in MAU -  Cervicovaginal ancillary only  Preterm labor symptoms and general obstetric precautions including but not limited to vaginal bleeding, contractions, leaking of fluid and fetal movement were reviewed in detail with the patient. Please refer to After Visit Summary for other counseling recommendations.   Return to clinic in 4 weeks as scheduled. Return sooner should concerns arise.  Future Appointments  Date Time Provider Department Center  02/11/2024  1:00 PM [facility] PROVIDER 1 [facility]  02/11/2024  1:30 PM [facility] US5 [facility]    Alan Monte, FNP student

## 2024-01-07 NOTE — Progress Notes (Signed)
 Pt reports her uterus came down with her last pregnancy. Feeling pressure similar to when that occurred.    Pt reports normal PAP 2 years ago at [facility].

## 2024-01-08 LAB — CERVICOVAGINAL ANCILLARY ONLY
Bacterial Vaginitis (gardnerella): NEGATIVE
Candida Glabrata: NEGATIVE
Candida Vaginitis: NEGATIVE
Comment: NEGATIVE
Comment: NEGATIVE
Comment: NEGATIVE

## 2024-01-09 ENCOUNTER — Ambulatory Visit: Payer: Self-pay | Admitting: Obstetrics and Gynecology

## 2024-01-09 DIAGNOSIS — Z3480 Encounter for supervision of other normal pregnancy, unspecified trimester: Secondary | ICD-10-CM

## 2024-01-09 LAB — AFP, SERUM, OPEN SPINA BIFIDA
AFP MoM: 1.11
AFP Value: 34.9 ng/mL
Gest. Age on Collection Date: 16 wk
Maternal Age At EDD: 30.1 a
OSBR Risk 1 IN: 8647
Test Results:: NEGATIVE
Weight: 161 [lb_av]

## 2024-01-21 DIAGNOSIS — Z419 Encounter for procedure for purposes other than remedying health state, unspecified: Secondary | ICD-10-CM | POA: Diagnosis not present

## 2024-01-28 ENCOUNTER — Telehealth: Payer: Self-pay

## 2024-01-28 NOTE — Telephone Encounter (Signed)
 APPT

## 2024-02-04 ENCOUNTER — Ambulatory Visit (INDEPENDENT_AMBULATORY_CARE_PROVIDER_SITE_OTHER): Admitting: Family Medicine

## 2024-02-04 ENCOUNTER — Encounter: Payer: Self-pay | Admitting: Family Medicine

## 2024-02-04 VITALS — BP 123/71 | HR 111 | Wt 164.4 lb

## 2024-02-04 DIAGNOSIS — Z603 Acculturation difficulty: Secondary | ICD-10-CM

## 2024-02-04 DIAGNOSIS — N644 Mastodynia: Secondary | ICD-10-CM

## 2024-02-04 DIAGNOSIS — Z3480 Encounter for supervision of other normal pregnancy, unspecified trimester: Secondary | ICD-10-CM

## 2024-02-04 DIAGNOSIS — Z3A20 20 weeks gestation of pregnancy: Secondary | ICD-10-CM | POA: Diagnosis not present

## 2024-02-04 DIAGNOSIS — Z758 Other problems related to medical facilities and other health care: Secondary | ICD-10-CM

## 2024-02-04 NOTE — Progress Notes (Signed)
 PRENATAL VISIT NOTE  Subjective:  Laura Schmidt is a 28 y.o. H4E7977 at [redacted]w[redacted]d being seen today for ongoing prenatal care.  She is currently monitored for the following issues for this low-risk pregnancy and has Language barrier; Supervision of other normal pregnancy, antepartum; and Breast pain, left on their problem list.  Patient reports left breast pain x1 week.  The pain is near her areola and radiates up the left axilla. Denies erythema, warmth, discharge, fever. She reports she had similar pain while she was breastfeeding, and it eventually resolved with continued breastfeeding. She has not tried warm compresses, massage, or Tylenol  at home. Contractions: Irritability. Vag. Bleeding: None.  Movement: Present. Denies leaking of fluid.   The following portions of the patient's history were reviewed and updated as appropriate: allergies, current medications, past family history, past medical history, past social history, past surgical history and problem list.   Objective:   Vitals:   02/04/24 1002  BP: 123/71  Pulse: (!) 111  Weight: 164 lb 6.4 oz (74.6 kg)    Fetal Status:  Fetal Heart Rate (bpm): 147   Movement: Present    General: Alert, oriented and cooperative. Patient is in no acute distress.  Skin: Skin is warm and dry. No rash noted.   Cardiovascular: Normal heart rate noted  Respiratory: Normal respiratory effort, no problems with respiration noted  Breast: Nonerythematous, no warmth, no nipple discharge. Dense breast tissue bilaterally. Mild left axillary lymphedema. Mild tenderness over left upper outer quadrant on palpation.  Abdomen: Soft, gravid, appropriate for gestational age.  Pain/Pressure: Present     Pelvic: Cervical exam deferred        Extremities: Normal range of motion.  Edema: None  Mental Status: Normal mood and affect. Normal behavior. Normal judgment and thought content.      11/26/2023   10:55 AM 02/21/2020    1:40 PM  Depression screen PHQ  2/9  Decreased Interest 0 0  Down, Depressed, Hopeless 0 0  PHQ - 2 Score 0 0  Altered sleeping 0 0  Tired, decreased energy 1 2  Change in appetite 0 0  Feeling bad or failure about yourself  0 0  Trouble concentrating 0 0  Moving slowly or fidgety/restless 0 0  Suicidal thoughts 0 0  PHQ-9 Score 1  2      Data saved with a previous flowsheet row definition        11/26/2023   10:55 AM 02/21/2020    1:40 PM  GAD 7 : Generalized Anxiety Score  Nervous, Anxious, on Edge 0 0  Control/stop worrying 0 0  Worry too much - different things 0 0  Trouble relaxing 0 0  Restless 0 0  Easily annoyed or irritable 0 0  Afraid - awful might happen 0 0  Total GAD 7 Score 0 0    Assessment and Plan:  Pregnancy: H4E7977 at [redacted]w[redacted]d 1. Supervision of other normal pregnancy, antepartum (Primary) Prenatal course reviewed BP, HR, FHR within normal limits Feeling regular FM   2. Language barrier Due to language barrier, an in-person interpreter was present during the history-taking, physical exam, and subsequent discussion with this patient.    3. Breast pain, left Discussed conservative management x1-2 weeks before ordering imaging versus ordering imaging now. Patient would like to start with conservative management with warm compress, massage, and Tylenol  x1 week as she had a similar episode of breast symptoms while breastfeeding which eventually resolved. If conservative management does not provide relief, agreeable  to proceeding with imaging.  4. [redacted] weeks gestation of pregnancy Anatomy US  scheduled for 12/3 Return in 4 weeks for prenatal care  Preterm labor symptoms and general obstetric precautions including but not limited to vaginal bleeding, contractions, leaking of fluid and fetal movement were reviewed in detail with the patient. Please refer to After Visit Summary for other counseling recommendations.   Return in about 4 weeks (around 03/03/2024) for LOB.  Future Appointments   Date Time Provider Department Center  02/11/2024  1:00 PM [facility] PROVIDER 1 [facility]  02/11/2024  1:30 PM [facility] US5 [facility]  03/01/2024 11:15 AM Laura Nidia CROME, FNP [facility] None    Joesph DELENA Sear, [place]

## 2024-02-04 NOTE — Progress Notes (Signed)
 ROB.   Left breast hurts. Denies discharge or redness. Been hurting about a week. Near areola and left underarm.   No other concerns today.

## 2024-02-11 ENCOUNTER — Ambulatory Visit (HOSPITAL_BASED_OUTPATIENT_CLINIC_OR_DEPARTMENT_OTHER): Admitting: Obstetrics and Gynecology

## 2024-02-11 ENCOUNTER — Ambulatory Visit: Attending: Obstetrics and Gynecology

## 2024-02-11 DIAGNOSIS — Z363 Encounter for antenatal screening for malformations: Secondary | ICD-10-CM | POA: Diagnosis not present

## 2024-02-11 DIAGNOSIS — Z3A21 21 weeks gestation of pregnancy: Secondary | ICD-10-CM

## 2024-02-11 DIAGNOSIS — Z3480 Encounter for supervision of other normal pregnancy, unspecified trimester: Secondary | ICD-10-CM | POA: Insufficient documentation

## 2024-02-11 DIAGNOSIS — O09292 Supervision of pregnancy with other poor reproductive or obstetric history, second trimester: Secondary | ICD-10-CM

## 2024-02-11 DIAGNOSIS — D649 Anemia, unspecified: Secondary | ICD-10-CM | POA: Diagnosis not present

## 2024-02-11 DIAGNOSIS — O99012 Anemia complicating pregnancy, second trimester: Secondary | ICD-10-CM | POA: Diagnosis not present

## 2024-02-11 NOTE — Progress Notes (Signed)
  Maternal-Fetal Medicine Consultation   Name: Laura Schmidt  MRN: [id number]  GA: [id number] [redacted]w[redacted]d    Patient is here for fetal anatomy scan.  On cell-free fetal DNA screening, the risks of aneuploidies are not increased. MSAFP screening showed low risk for open-neural tube defects.   History significant for 2 term vaginal deliveries.Her second child 9 pounds 11 ounces at birth.  Her pregnancy was not complicated by gestational diabetes.   Ultrasound Fetal biometry is consistent with her previously-established dates. Normal amniotic fluid.  No makers of aneuploidies or fetal structural defects are seen.  Patient understands the limitations of ultrasound in detecting fetal anomalies.    Patient's first child weighed 9 pounds and 13 ounces at birth.  In the absence of gestational diabetes, we will resume that for large baby) constitutional.  I discussed that ultrasound has limitations in accurately estimating fetal weights.  It is reasonable to consider a growth assessment at [redacted] weeks gestation.   Recommendations -Recommend fetal growth at 37 weeks' gestation to estimate fetal weight.         Consultation including face-to-face (more than 50%) counseling 15 minutes.

## 2024-02-11 NOTE — Progress Notes (Unsigned)
 Maternal-Fetal Medicine Consultation  Name: Laura Schmidt  MRN: [id number]  GA: [id number] [redacted]w[redacted]d   Patient is here for fetal anatomy scan.  On cell-free fetal DNA screening, the risks of aneuploidies are not increased. MSAFP screening showed low risk for open-neural tube defects.  History significant for 2 term vaginal deliveries.Her second child 9 pounds 11 ounces at birth.  Her pregnancy was not complicated by gestational diabetes.  Ultrasound Fetal biometry is consistent with her previously-established dates. Normal amniotic fluid.  No makers of aneuploidies or fetal structural defects are seen.  Patient understands the limitations of ultrasound in detecting fetal anomalies.   Patient's first child weighed 9 pounds and 13 ounces at birth.  In the absence of gestational diabetes, we will resume that for large baby) constitutional.  I discussed that ultrasound has limitations in accurately estimating fetal weights.  It is reasonable to consider a growth assessment at [redacted] weeks gestation.  Recommendations -Recommend fetal growth at 37 weeks' gestation to estimate fetal weight.     Consultation including face-to-face (more than 50%) counseling 15 minutes.

## 2024-02-16 ENCOUNTER — Other Ambulatory Visit: Payer: Self-pay | Admitting: Family Medicine

## 2024-02-16 DIAGNOSIS — N644 Mastodynia: Secondary | ICD-10-CM

## 2024-02-16 NOTE — Progress Notes (Signed)
 Staff contacted patient with interpreter to ask about breast pain. Previously noted left breast pain near the areola radiating up the left axilla. Recommended conservative management. Patient states pain has not changed since that time. Placing order for left breast US .  Deidra Bang, [place], RN  Wallace, [place] A, [place] Pt stated her pain is the same as discussed w you in office, especially when she touches the area.       Previous Messages    ----- Message ----- From: Wallace Joesph LABOR, PA Sent: 02/16/2024   3:39 PM EST To: [facility] Clinical Pool  If someone could call with the interpreter and just check in to see if she is still experiencing the breast pain we talked about at her last office visit. Let me know, thanks!

## 2024-02-18 ENCOUNTER — Inpatient Hospital Stay: Admission: RE | Admit: 2024-02-18 | Discharge: 2024-02-18 | Attending: Family Medicine

## 2024-02-18 DIAGNOSIS — N644 Mastodynia: Secondary | ICD-10-CM

## 2024-02-19 ENCOUNTER — Ambulatory Visit: Payer: Self-pay | Admitting: Family Medicine

## 2024-02-25 ENCOUNTER — Ambulatory Visit: Admitting: Obstetrics and Gynecology

## 2024-02-25 ENCOUNTER — Encounter: Payer: Self-pay | Admitting: Obstetrics and Gynecology

## 2024-02-25 VITALS — BP 118/69 | HR 94 | Wt 170.0 lb

## 2024-02-25 DIAGNOSIS — Z603 Acculturation difficulty: Secondary | ICD-10-CM | POA: Diagnosis not present

## 2024-02-25 DIAGNOSIS — M545 Low back pain, unspecified: Secondary | ICD-10-CM

## 2024-02-25 DIAGNOSIS — Z758 Other problems related to medical facilities and other health care: Secondary | ICD-10-CM

## 2024-02-25 DIAGNOSIS — K5909 Other constipation: Secondary | ICD-10-CM

## 2024-02-25 DIAGNOSIS — Z3480 Encounter for supervision of other normal pregnancy, unspecified trimester: Secondary | ICD-10-CM | POA: Diagnosis not present

## 2024-02-25 DIAGNOSIS — R0981 Nasal congestion: Secondary | ICD-10-CM

## 2024-02-25 DIAGNOSIS — Z3A23 23 weeks gestation of pregnancy: Secondary | ICD-10-CM | POA: Diagnosis not present

## 2024-02-25 DIAGNOSIS — K649 Unspecified hemorrhoids: Secondary | ICD-10-CM

## 2024-02-25 MED ORDER — DOCUSATE SODIUM 100 MG PO CAPS: 100.0000 mg | ORAL_CAPSULE | Freq: Two times a day (BID) | ORAL | 2 refills | Status: AC | PRN

## 2024-02-25 MED ORDER — FLUTICASONE PROPIONATE 50 MCG/ACT NA SUSP: NASAL | 2 refills | Status: AC

## 2024-02-25 MED ORDER — HYDROCORTISONE (PERIANAL) 2.5 % EX CREA: TOPICAL_CREAM | Freq: Two times a day (BID) | CUTANEOUS | 2 refills | Status: AC

## 2024-02-25 NOTE — Progress Notes (Signed)
 PRENATAL VISIT NOTE  Subjective:  Laura Schmidt is a 28 y.o. H4E7977 at [redacted]w[redacted]d being seen today for ongoing prenatal care.  She is currently monitored for the following issues for this low-risk pregnancy and has Language barrier; Supervision of other normal pregnancy, antepartum; and Breast pain, left on their problem list.  Patient reports constipation, hemorrhoid, low back pain .  Contractions: Not present. Vag. Bleeding: None.  Movement: Present. Denies leaking of fluid.   The following portions of the patient's history were reviewed and updated as appropriate: allergies, current medications, past family history, past medical history, past social history, past surgical history and problem list.   Objective:   Vitals:   02/25/24 1032  BP: 118/69  Pulse: 94  Weight: 170 lb (77.1 kg)    Fetal Status:  Fetal Heart Rate (bpm): 152   Movement: Present    General: Alert, oriented and cooperative. Patient is in no acute distress.  Skin: Skin is warm and dry. No rash noted.   Cardiovascular: Normal heart rate noted  Respiratory: Normal respiratory effort, no problems with respiration noted  Abdomen: Soft, gravid, appropriate for gestational age.  Pain/Pressure: Present     Pelvic: Cervical exam deferred        Extremities: Normal range of motion.  Edema: None  Mental Status: Normal mood and affect. Normal behavior. Normal judgment and thought content.      11/26/2023   10:55 AM 02/21/2020    1:40 PM  Depression screen PHQ 2/9  Decreased Interest 0 0  Down, Depressed, Hopeless 0 0  PHQ - 2 Score 0 0  Altered sleeping 0 0  Tired, decreased energy 1 2  Change in appetite 0 0  Feeling bad or failure about yourself  0 0  Trouble concentrating 0 0  Moving slowly or fidgety/restless 0 0  Suicidal thoughts 0 0  PHQ-9 Score 1  2      Data saved with a previous flowsheet row definition        11/26/2023   10:55 AM 02/21/2020    1:40 PM  GAD 7 : Generalized Anxiety Score   Nervous, Anxious, on Edge 0 0  Control/stop worrying 0 0  Worry too much - different things 0 0  Trouble relaxing 0 0  Restless 0 0  Easily annoyed or irritable 0 0  Afraid - awful might happen 0 0  Total GAD 7 Score 0 0    Assessment and Plan:  Pregnancy: [id number] at [redacted]w[redacted]d 1. Supervision of other normal pregnancy, antepartum (Primary) BP and FHR normal Doing well, feeling regular movement    2. [redacted] weeks gestation of pregnancy BP and FHR normal Doing well, feeling regular movement    3. Language barrier In person interpreter present for visit   4. Other constipation Discussed dietary changes, prune juice, stool softener  - docusate sodium  (COLACE) 100 MG capsule; Take 1 capsule (100 mg total) by mouth 2 (two) times daily as needed.  Dispense: 30 capsule; Refill: 2  5. Hemorrhoids, unspecified hemorrhoid type  - hydrocortisone  (ANUSOL -HC) 2.5 % rectal cream; Place rectally 2 (two) times daily.  Dispense: 30 g; Refill: 2  6. Acute midline low back pain without sciatica Discussed stretches, maternity belt, tylenol    7. Nasal congestion  - fluticasone  (FLONASE ) 50 MCG/ACT nasal spray; Spray 1 spray in each  nare once in the morning and once at bedtime  Dispense: 9.9 mL; Refill: 2  Preterm labor symptoms and general obstetric precautions including but not limited  to vaginal bleeding, contractions, leaking of fluid and fetal movement were reviewed in detail with the patient. Please refer to After Visit Summary for other counseling recommendations.   Return in about 4 weeks (around 03/24/2024) for OB VISIT (MD or APP), 2 hr GTT.   Nidia Daring, FNP

## 2024-02-25 NOTE — Progress Notes (Signed)
 Pt presents for ROB visit. Pt c/po back pain, congestion and shortness of breath at night.

## 2024-03-01 ENCOUNTER — Encounter: Admitting: Obstetrics and Gynecology

## 2024-03-15 ENCOUNTER — Inpatient Hospital Stay (HOSPITAL_COMMUNITY)

## 2024-03-15 ENCOUNTER — Encounter (HOSPITAL_COMMUNITY): Payer: Self-pay | Admitting: Family Medicine

## 2024-03-15 ENCOUNTER — Inpatient Hospital Stay (HOSPITAL_COMMUNITY)
Admission: AD | Admit: 2024-03-15 | Discharge: 2024-03-15 | Disposition: A | Discharge status: Home / Self Care | Attending: Obstetrics & Gynecology | Admitting: Obstetrics & Gynecology

## 2024-03-15 ENCOUNTER — Encounter (HOSPITAL_COMMUNITY): Payer: Self-pay | Admitting: Obstetrics & Gynecology

## 2024-03-15 ENCOUNTER — Inpatient Hospital Stay (HOSPITAL_COMMUNITY)
Admission: AD | Admit: 2024-03-15 | Discharge: 2024-03-15 | Disposition: A | Discharge status: Home / Self Care | Attending: Emergency Medicine | Admitting: Emergency Medicine

## 2024-03-15 ENCOUNTER — Other Ambulatory Visit: Payer: Self-pay

## 2024-03-15 DIAGNOSIS — O26892 Other specified pregnancy related conditions, second trimester: Secondary | ICD-10-CM

## 2024-03-15 DIAGNOSIS — Z3A25 25 weeks gestation of pregnancy: Secondary | ICD-10-CM | POA: Insufficient documentation

## 2024-03-15 DIAGNOSIS — O99512 Diseases of the respiratory system complicating pregnancy, second trimester: Secondary | ICD-10-CM | POA: Insufficient documentation

## 2024-03-15 DIAGNOSIS — J1189 Influenza due to unidentified influenza virus with other manifestations: Secondary | ICD-10-CM | POA: Insufficient documentation

## 2024-03-15 DIAGNOSIS — O98512 Other viral diseases complicating pregnancy, second trimester: Secondary | ICD-10-CM | POA: Diagnosis not present

## 2024-03-15 DIAGNOSIS — E860 Dehydration: Secondary | ICD-10-CM

## 2024-03-15 DIAGNOSIS — O99612 Diseases of the digestive system complicating pregnancy, second trimester: Secondary | ICD-10-CM | POA: Insufficient documentation

## 2024-03-15 DIAGNOSIS — Z20828 Contact with and (suspected) exposure to other viral communicable diseases: Secondary | ICD-10-CM | POA: Diagnosis not present

## 2024-03-15 DIAGNOSIS — R071 Chest pain on breathing: Secondary | ICD-10-CM | POA: Diagnosis present

## 2024-03-15 DIAGNOSIS — O368320 Maternal care for abnormalities of the fetal heart rate or rhythm, second trimester, not applicable or unspecified: Secondary | ICD-10-CM | POA: Insufficient documentation

## 2024-03-15 DIAGNOSIS — R000 Tachycardia, unspecified: Secondary | ICD-10-CM | POA: Insufficient documentation

## 2024-03-15 DIAGNOSIS — K449 Diaphragmatic hernia without obstruction or gangrene: Secondary | ICD-10-CM | POA: Insufficient documentation

## 2024-03-15 DIAGNOSIS — O368120 Decreased fetal movements, second trimester, not applicable or unspecified: Secondary | ICD-10-CM | POA: Insufficient documentation

## 2024-03-15 DIAGNOSIS — R911 Solitary pulmonary nodule: Secondary | ICD-10-CM | POA: Insufficient documentation

## 2024-03-15 DIAGNOSIS — R0682 Tachypnea, not elsewhere classified: Secondary | ICD-10-CM | POA: Insufficient documentation

## 2024-03-15 DIAGNOSIS — J9811 Atelectasis: Secondary | ICD-10-CM | POA: Insufficient documentation

## 2024-03-15 DIAGNOSIS — J101 Influenza due to other identified influenza virus with other respiratory manifestations: Secondary | ICD-10-CM | POA: Diagnosis not present

## 2024-03-15 LAB — COMPREHENSIVE METABOLIC PANEL WITH GFR
ALT: 28 U/L (ref 0–44)
ALT: 32 U/L (ref 0–44)
AST: 33 U/L (ref 15–41)
AST: 33 U/L (ref 15–41)
Albumin: 3.8 g/dL (ref 3.5–5.0)
Albumin: 3.6 g/dL (ref 3.5–5.0)
Alkaline Phosphatase: 64 U/L (ref 38–126)
Alkaline Phosphatase: 60 U/L (ref 38–126)
Anion gap: 12 (ref 5–15)
Anion gap: 12 (ref 5–15)
BUN: 6 mg/dL (ref 6–20)
BUN: <5 mg/dL — ABNORMAL LOW (ref 6–20)
CO2: 23 mmol/L (ref 22–32)
CO2: 23 mmol/L (ref 22–32)
Calcium: 8.2 mg/dL — ABNORMAL LOW (ref 8.9–10.3)
Calcium: 8.5 mg/dL — ABNORMAL LOW (ref 8.9–10.3)
Chloride: 101 mmol/L (ref 98–111)
Chloride: 101 mmol/L (ref 98–111)
Creatinine, Ser: 0.44 mg/dL (ref 0.44–1.00)
Creatinine, Ser: 0.48 mg/dL (ref 0.44–1.00)
GFR, Estimated: >60 mL/min
GFR, Estimated: >60 mL/min
Glucose, Bld: 121 mg/dL — ABNORMAL HIGH (ref 70–99)
Glucose, Bld: 102 mg/dL — ABNORMAL HIGH (ref 70–99)
Potassium: 3.5 mmol/L (ref 3.5–5.1)
Potassium: 3.5 mmol/L (ref 3.5–5.1)
Sodium: 137 mmol/L (ref 135–145)
Sodium: 137 mmol/L (ref 135–145)
Total Bilirubin: 0.3 mg/dL (ref 0.0–1.2)
Total Bilirubin: 0.3 mg/dL (ref 0.0–1.2)
Total Protein: 6.8 g/dL (ref 6.5–8.1)
Total Protein: 6.5 g/dL (ref 6.5–8.1)

## 2024-03-15 LAB — CBC WITH DIFFERENTIAL/PLATELET
Abs Immature Granulocytes: 0.06 K/uL (ref 0.00–0.07)
Basophils Absolute: 0 K/uL (ref 0.0–0.1)
Basophils Relative: 0 %
Eosinophils Absolute: 0 K/uL (ref 0.0–0.5)
Eosinophils Relative: 0 %
HCT: 31.8 % — ABNORMAL LOW (ref 36.0–46.0)
Hemoglobin: 10.6 g/dL — ABNORMAL LOW (ref 12.0–15.0)
Immature Granulocytes: 1 %
Lymphocytes Relative: 8 %
Lymphs Abs: 0.8 K/uL (ref 0.7–4.0)
MCH: 27.9 pg (ref 26.0–34.0)
MCHC: 33.3 g/dL (ref 30.0–36.0)
MCV: 83.7 fL (ref 80.0–100.0)
Monocytes Absolute: 0.6 K/uL (ref 0.1–1.0)
Monocytes Relative: 6 %
Neutro Abs: 8.6 K/uL — ABNORMAL HIGH (ref 1.7–7.7)
Neutrophils Relative %: 85 %
Platelets: 222 K/uL (ref 150–400)
RBC: 3.8 MIL/uL — ABNORMAL LOW (ref 3.87–5.11)
RDW: 14.2 % (ref 11.5–15.5)
WBC: 10.1 K/uL (ref 4.0–10.5)
nRBC: 0 % (ref 0.0–0.2)

## 2024-03-15 LAB — RESP PANEL BY RT-PCR (RSV, FLU A&B, COVID)  RVPGX2
Influenza A by PCR: POSITIVE — AB
Influenza B by PCR: NEGATIVE
Resp Syncytial Virus by PCR: NEGATIVE
SARS Coronavirus 2 by RT PCR: NEGATIVE

## 2024-03-15 LAB — CBC
HCT: 33.8 % — ABNORMAL LOW (ref 36.0–46.0)
Hemoglobin: 11.3 g/dL — ABNORMAL LOW (ref 12.0–15.0)
MCH: 28.1 pg (ref 26.0–34.0)
MCHC: 33.4 g/dL (ref 30.0–36.0)
MCV: 84.1 fL (ref 80.0–100.0)
Platelets: 235 K/uL (ref 150–400)
RBC: 4.02 MIL/uL (ref 3.87–5.11)
RDW: 14.2 % (ref 11.5–15.5)
WBC: 12 K/uL — ABNORMAL HIGH (ref 4.0–10.5)
nRBC: 0 % (ref 0.0–0.2)

## 2024-03-15 LAB — LACTIC ACID, PLASMA
Lactic Acid, Venous: 1.4 mmol/L (ref 0.5–1.9)
Lactic Acid, Venous: 1.3 mmol/L (ref 0.5–1.9)

## 2024-03-15 MED ORDER — HYDROCOD POLI-CHLORPHE POLI ER 10-8 MG/5ML PO SUER: 5.0000 mL | Freq: Once | ORAL | Status: DC

## 2024-03-15 MED ORDER — OSELTAMIVIR PHOSPHATE 75 MG PO CAPS
75.0000 mg | ORAL_CAPSULE | Freq: Once | ORAL | Status: DC
  Filled 2024-03-15: qty 1

## 2024-03-15 MED ORDER — LORATADINE 10 MG PO TABS
10.0000 mg | ORAL_TABLET | Freq: Every day | ORAL | Status: DC
  Administered 2024-03-15: 10 mg via ORAL
  Filled 2024-03-15: qty 1

## 2024-03-15 MED ORDER — LACTATED RINGERS IV BOLUS
1000.0000 mL | Freq: Once | INTRAVENOUS | Status: AC
  Administered 2024-03-15: 1000 mL via INTRAVENOUS

## 2024-03-15 MED ORDER — ONDANSETRON HCL 4 MG/2ML IJ SOLN: 4.0000 mg | Freq: Once | INTRAMUSCULAR | Status: DC

## 2024-03-15 MED ORDER — CYCLOBENZAPRINE HCL 5 MG PO TABS: 5.0000 mg | ORAL_TABLET | Freq: Three times a day (TID) | ORAL | 0 refills | Status: AC | PRN

## 2024-03-15 MED ORDER — CYCLOBENZAPRINE HCL 10 MG PO TABS
10.0000 mg | ORAL_TABLET | Freq: Once | ORAL | Status: AC
  Administered 2024-03-15: 10 mg via ORAL
  Filled 2024-03-15: qty 1

## 2024-03-15 MED ORDER — DM-GUAIFENESIN ER 30-600 MG PO TB12
2.0000 | ORAL_TABLET | Freq: Once | ORAL | Status: AC
  Administered 2024-03-15: 2 via ORAL
  Filled 2024-03-15: qty 2

## 2024-03-15 MED ORDER — OSELTAMIVIR PHOSPHATE 75 MG PO CAPS: 75.0000 mg | ORAL_CAPSULE | Freq: Two times a day (BID) | ORAL | 0 refills | Status: AC

## 2024-03-15 MED ORDER — SALINE SPRAY 0.65 % NA SOLN
1.0000 | NASAL | Status: DC | PRN
  Administered 2024-03-15: 1 via NASAL
  Filled 2024-03-15: qty 44

## 2024-03-15 MED ORDER — ACETAMINOPHEN 500 MG PO TABS
1000.0000 mg | ORAL_TABLET | Freq: Once | ORAL | Status: AC
  Administered 2024-03-15: 1000 mg via ORAL
  Filled 2024-03-15: qty 2

## 2024-03-15 MED ORDER — PHENOL 1.4 % MT LIQD
1.0000 | OROMUCOSAL | Status: DC | PRN
  Administered 2024-03-15: 1 via OROMUCOSAL
  Filled 2024-03-15: qty 177

## 2024-03-15 MED ORDER — IOHEXOL 350 MG/ML SOLN
75.0000 mL | Freq: Once | INTRAVENOUS | Status: AC | PRN
  Administered 2024-03-15: 75 mL via INTRAVENOUS

## 2024-03-15 MED ORDER — ACETAMINOPHEN 325 MG PO TABS
650.0000 mg | ORAL_TABLET | Freq: Once | ORAL | Status: AC
  Administered 2024-03-15: 650 mg via ORAL
  Filled 2024-03-15: qty 2

## 2024-03-15 MED ORDER — OSELTAMIVIR PHOSPHATE 75 MG PO CAPS
75.0000 mg | ORAL_CAPSULE | Freq: Once | ORAL | Status: AC
  Administered 2024-03-15: 75 mg via ORAL

## 2024-03-15 NOTE — MAU Note (Signed)
 Consent signed for CT. Original sent with patient. Copy in MAU scan box.

## 2024-03-15 NOTE — MAU Provider Note (Cosign Needed Addendum)
 " History     CSN: [id number]  Arrival date and time: 03/15/24 0532   Event Date/Time   First Provider Initiated Contact with Patient 03/15/24 [phone]      Chief Complaint  Patient presents with   Abdominal Pain   Back Pain   HPI [name] is a 29 y.o. year old [id number] female at [redacted]w[redacted]d weeks gestation who presents to MAU reporting flu-like symptoms: SOB, difficulty taking a deep breath, nasal congestion, sore throat, and cough. She reports her husband has flu-like symptoms, but did not go to the doctor for testing. She reports her symptoms started Sunday morning. She has been taking OTC [facility] Tussin DM for her symptoms; last dose 0330. She also reports she has felt contractions since last night. She receives [facility] with [facility]; next appt is 03/25/2024.   OB History     Gravida  5   Para  2   Term  2   Preterm  0   AB  2   Living  2      SAB  2   IAB  0   Ectopic  0   Multiple  0   Live Births  2           Past Medical History:  Diagnosis Date   Anemia     Past Surgical History:  Procedure Laterality Date   NO PAST SURGERIES      Family History  Problem Relation Age of Onset   Diabetes Mother     Social History[1]  Allergies: Allergies[2]  No medications prior to admission.    Review of Systems  Constitutional:  Positive for diaphoresis.  HENT:  Positive for congestion and sore throat.   Eyes: Negative.   Respiratory:  Positive for cough and shortness of breath.   Cardiovascular: Negative.   Gastrointestinal: Negative.   Endocrine: Negative.   Genitourinary:  Positive for pelvic pain (contractions since last night).  Musculoskeletal: Negative.   Skin: Negative.   Allergic/Immunologic: Negative.   Neurological: Negative.   Hematological: Negative.   Psychiatric/Behavioral: Negative.     Physical Exam   Patient Vitals for the past 24 hrs:  BP Temp Temp src Pulse Resp SpO2 Weight  03/15/24 1105 (!) 103/56 -- -- (!) 118 15  100 % --  03/15/24 1025 (!) 89/44 -- -- (!) 113 18 97 % --  03/15/24 1000 -- -- -- -- -- 97 % --  03/15/24 0930 -- -- -- (!) 115 -- 98 % --  03/15/24 0900 (!) 107/90 -- -- (!) 125 (!) 24 98 % --  03/15/24 0835 (!) 100/50 -- -- (!) 126 (!) 24 98 % --  03/15/24 0834 -- 99.8 F (37.7 C) Axillary -- -- -- --  03/15/24 0815 (!) 81/65 -- -- (!) 108 (!) 24 99 % --  03/15/24 0800 (!) 92/42 -- -- (!) 107 (!) 22 98 % --  03/15/24 0744 104/63 99.9 F (37.7 C) Oral (!) 114 (!) 22 100 % --  03/15/24 0730 (!) 91/48 -- -- 98 -- -- --  03/15/24 0722 -- -- -- -- (!) 24 99 % --  03/15/24 0715 (!) 93/58 -- -- 99 -- -- --  03/15/24 0714 -- -- -- -- -- 97 % --  03/15/24 0700 97/66 99.5 F (37.5 C) Oral (!) 114 -- 100 % --  03/15/24 0645 105/65 -- -- (!) 122 -- -- --  03/15/24 0627 119/65 -- -- (!) 116 -- -- --  03/15/24 9377 --  99.4 F (37.4 C) -- -- (!) 22 -- --  03/15/24 0610 -- -- -- -- -- -- 76.6 kg  03/15/24 0536 (!) 121/59 98.6 F (37 C) Oral (!) 127 20 98 % --    Physical Exam Vitals and nursing note reviewed.  Constitutional:      Appearance: Normal appearance. She is normal weight.  Cardiovascular:     Rate and Rhythm: Tachycardia present.  Pulmonary:     Effort: Respiratory distress present.  Musculoskeletal:        General: Normal range of motion.  Skin:    General: Skin is warm and dry.  Neurological:     Mental Status: She is alert and oriented to person, place, and time.  Psychiatric:        Mood and Affect: Mood normal.        Behavior: Behavior normal.        Thought Content: Thought content normal.        Judgment: Judgment normal.    REASSURING NST - FHR: 150 bpm / moderate variability / accels present / decels present / TOCO: none tracing, but pt reports feeling contractions  MAU Course  Procedures  MDM Respiratory Panel Start IV LR bolus 1 liter @ 999 ml/hr CBC CMP Mucinex  2 tablets Claritin  10 mg po  Tamiflu  75 mg po -- will HOLD until (+) Flu  results Nasal Spray 1 spray per nare  Results for orders placed or performed during the hospital encounter of 03/15/24 (from the past 24 hours)  CBC     Status: Abnormal   Collection Time: 03/15/24  6:31 AM  Result Value Ref Range   WBC 12.0 (H) 4.0 - 10.5 K/uL   RBC 4.02 3.87 - 5.11 MIL/uL   Hemoglobin 11.3 (L) 12.0 - 15.0 g/dL   HCT 66.1 (L) 63.9 - 53.9 %   MCV 84.1 80.0 - 100.0 fL   MCH 28.1 26.0 - 34.0 pg   MCHC 33.4 30.0 - 36.0 g/dL   RDW 85.7 88.4 - 84.4 %   Platelets 235 150 - 400 K/uL   nRBC 0.0 0.0 - 0.2 %  Comprehensive metabolic panel with GFR     Status: Abnormal   Collection Time: 03/15/24  6:31 AM  Result Value Ref Range   Sodium 137 135 - 145 mmol/L   Potassium 3.5 3.5 - 5.1 mmol/L   Chloride 101 98 - 111 mmol/L   CO2 23 22 - 32 mmol/L   Glucose, Bld 121 (H) 70 - 99 mg/dL   BUN 6 6 - 20 mg/dL   Creatinine, Ser 9.55 0.44 - 1.00 mg/dL   Calcium 8.2 (L) 8.9 - 10.3 mg/dL   Total Protein 6.8 6.5 - 8.1 g/dL   Albumin 3.8 3.5 - 5.0 g/dL   AST 33 15 - 41 U/L   ALT 28 0 - 44 U/L   Alkaline Phosphatase 64 38 - 126 U/L   Total Bilirubin 0.3 0.0 - 1.2 mg/dL   GFR, Estimated >39 >39 mL/min   Anion gap 12 5 - 15  Resp panel by RT-PCR (RSV, Flu A&B, Covid) Anterior Nasal Swab     Status: Abnormal   Collection Time: 03/15/24  6:57 AM   Specimen: Anterior Nasal Swab  Result Value Ref Range   SARS Coronavirus 2 by RT PCR NEGATIVE NEGATIVE   Influenza A by PCR POSITIVE (A) NEGATIVE   Influenza B by PCR NEGATIVE NEGATIVE   Resp Syncytial Virus by PCR NEGATIVE NEGATIVE  Lactic  acid, plasma     Status: None   Collection Time: 03/15/24  9:21 AM  Result Value Ref Range   Lactic Acid, Venous 1.4 0.5 - 1.9 mmol/L    Report given to and care assumed by Rocky Satterfield, FNP @ [phone].  Ala Cart, CNM 03/15/2024, 6:50 AM   Chest xray & lactic acid ordered. Both normal.  Tylenol , flexeril , & tamiflu  given. Patient reports improvement in symptoms.   Assessment and Plan   1.  Influenza A   2. [redacted] weeks gestation of pregnancy    -Rx tamiflu  -Reviewed return precautions  Rocky Satterfield     [1]  Social History Tobacco Use   Smoking status: Never   Smokeless tobacco: Never  Vaping Use   Vaping status: Never Used  Substance Use Topics   Alcohol use: Never   Drug use: Never  [2] No Known Allergies  "

## 2024-03-15 NOTE — ED Provider Triage Note (Signed)
 Emergency Medicine Provider OB Triage Evaluation Note  Laura Schmidt is a 29 y.o. female, [id number], at [redacted]w[redacted]d gestation who presents to the emergency department with complaints of flu-like illness. Reports pain in her chest making it hard to breathe; feels like she cannot cough due to pain. Husband sick with flu-like illness. Tmax 100F prior to arrival. Also with contractions which radiate to the back, beginning at midnight and becoming more intense. No vaginal bleeding or discharge. No prior hx of preterm labor.  Prenatal care through [facility]  Review of  Systems  Positive: as above Negative: as above  Physical Exam  BP (!) 121/59 (BP Location: Right Arm)   Pulse (!) 127   Temp 98.6 F (37 C) (Oral)   Resp 20   LMP 09/17/2023 (Exact Date)   SpO2 98%  General: Awake, no anxious appearing, agitated. HEENT: Atraumatic  Resp: Dyspnea w/o tachypnea Cardiac: Tachycardic rate Abd: Gravid abdomen MSK: Moves all extremities without difficulty Neuro: Speech clear  Medical Decision Making  Pt evaluated for pregnancy concern and is stable for transfer to MAU. Pt is in agreement with plan for transfer.  5:57 AM Discussed with MAU APP, who accepts patient in transfer.  Clinical Impression   1. Shortness of breath        Keith Sor, [place] 03/15/24 9441

## 2024-03-15 NOTE — Discharge Instructions (Signed)

## 2024-03-15 NOTE — MAU Provider Note (Addendum)
 " History     CSN: [id number]  Arrival date and time: 03/15/24 1735   Event Date/Time   First Provider Initiated Contact with Patient 03/15/24 1852      Chief Complaint  Patient presents with   Fever   Decreased Fetal Movement   Back Pain   HPI Caera Heuer is a 29 y.o. [id number] at [redacted]w[redacted]d who presents fever, SOB, body aches, & decreased fetal movement. Patient seen in MAU this morning & diagnosed with flu. Felt better at time of discharge. During the afternoon started to feel worse again. Took 1 tylenol  at 2 pm & tamiflu  at noon. Reports worsening shortness of breath & body pain that was not improved with tylenol . Hasn't been able to eat or drink since being discharged this morning (due to feeling unwell, not vomiting). Hasn't felt baby move all afternoon.  Denies abdominal pain, vaginal bleeding, or LOF.   OB History     Gravida  5   Para  2   Term  2   Preterm  0   AB  2   Living  2      SAB  2   IAB  0   Ectopic  0   Multiple  0   Live Births  2           Past Medical History:  Diagnosis Date   Anemia     Past Surgical History:  Procedure Laterality Date   NO PAST SURGERIES      Family History  Problem Relation Age of Onset   Diabetes Mother     Social History[1]  Allergies: Allergies[2]  Medications Prior to Admission  Medication Sig Dispense Refill Last Dose/Taking   fluticasone  (FLONASE ) 50 MCG/ACT nasal spray Spray 1 spray in each  nare once in the morning and once at bedtime 9.9 mL 2 03/14/2024   oseltamivir  (TAMIFLU ) 75 MG capsule Take 1 capsule (75 mg total) by mouth 2 (two) times daily for 5 days. 10 capsule 0 03/15/2024 at 12:00 PM   Prenatal Vit-Fe Fumarate-FA (PRENATAL VITAMINS PO) Take 1 tablet by mouth daily.   03/14/2024   docusate sodium  (COLACE) 100 MG capsule Take 1 capsule (100 mg total) by mouth 2 (two) times daily as needed. 30 capsule 2    ferrous sulfate 325 (65 FE) MG tablet Take 325 mg by mouth daily with breakfast.  (Patient not taking: Reported on 02/25/2024)      hydrocortisone  (ANUSOL -HC) 2.5 % rectal cream Place rectally 2 (two) times daily. 30 g 2 03/13/2024   ibuprofen  (ADVIL ) 600 MG tablet Take 1 tablet (600 mg total) by mouth every 6 (six) hours. 30 tablet 0     Review of Systems  All other systems reviewed and are negative.  Physical Exam   Blood pressure (!) 115/50, pulse (!) 136, temperature 99.6 F (37.6 C), temperature source Oral, resp. rate (!) 26, weight 75.9 kg, last menstrual period 09/17/2023, SpO2 97%, unknown if currently breastfeeding.  Physical Exam Vitals and nursing note reviewed.  Constitutional:      General: She is in acute distress.     Appearance: She is well-developed. She is ill-appearing.  HENT:     Head: Normocephalic and atraumatic.  Eyes:     General: No scleral icterus.       Right eye: No discharge.        Left eye: No discharge.     Conjunctiva/sclera: Conjunctivae normal.  Cardiovascular:     Rate and Rhythm: Regular  rhythm. Tachycardia present.  Pulmonary:     Effort: Pulmonary effort is normal. Tachypnea present. No respiratory distress.     Breath sounds: Normal breath sounds. No wheezing.  Skin:    General: Skin is warm and dry.  Neurological:     General: No focal deficit present.     Mental Status: She is alert.  Psychiatric:        Mood and Affect: Mood normal.        Behavior: Behavior normal.     MAU Course  Procedures Results for orders placed or performed during the hospital encounter of 03/15/24 (from the past 24 hours)  CBC with Differential/Platelet     Status: Abnormal   Collection Time: 03/15/24  7:30 PM  Result Value Ref Range   WBC 10.1 4.0 - 10.5 K/uL   RBC 3.80 (L) 3.87 - 5.11 MIL/uL   Hemoglobin 10.6 (L) 12.0 - 15.0 g/dL   HCT 68.1 (L) 63.9 - 53.9 %   MCV 83.7 80.0 - 100.0 fL   MCH 27.9 26.0 - 34.0 pg   MCHC 33.3 30.0 - 36.0 g/dL   RDW 85.7 88.4 - 84.4 %   Platelets 222 150 - 400 K/uL   nRBC 0.0 0.0 - 0.2 %    Neutrophils Relative % 85 %   Neutro Abs 8.6 (H) 1.7 - 7.7 K/uL   Lymphocytes Relative 8 %   Lymphs Abs 0.8 0.7 - 4.0 K/uL   Monocytes Relative 6 %   Monocytes Absolute 0.6 0.1 - 1.0 K/uL   Eosinophils Relative 0 %   Eosinophils Absolute 0.0 0.0 - 0.5 K/uL   Basophils Relative 0 %   Basophils Absolute 0.0 0.0 - 0.1 K/uL   Immature Granulocytes 1 %   Abs Immature Granulocytes 0.06 0.00 - 0.07 K/uL    MDM -Flu patient discharged this morning presents with worsening symptoms. Maternal & fetal tachycardia as well as tachypnea & fever. Labs repeated & chest CT ordered -IV fluids & tylenol  ordered  Care turned over to Dr. Magali Rocky Satterfield NP 03/15/2024 8:12 PM   -IV fluids administered with improvement in FHR.  -Tylenol  with reduction in fever and aches. -Oxygen saturations >94% -CT scan without evidence of pneumonia, consistent with influenza diagnosis. -Lactic acid normal -ECG interpreted as sinus tachycardia with no ST-T segment changes -CBC with mild anemia -CMP unremarkable for cause -Bcx pending  -Stable for discharge home -Discussed importance of hydration at home for recovery -Discussed expected clinical course of influenza A -All question answered, anticipatory guidance and detailed return precautions provided.  Barkley Magali, MD OB Fellow, Faculty Practice [facility], [facility] for [employer]     Assessment and Plan       [1]  Social History Tobacco Use   Smoking status: Never   Smokeless tobacco: Never  Vaping Use   Vaping status: Never Used  Substance Use Topics   Alcohol use: Never   Drug use: Never  [2] No Known Allergies  "

## 2024-03-15 NOTE — MAU Note (Signed)
 Laura Schmidt is a 29 y.o. at [redacted]w[redacted]d here in MAU reporting: contractions, SOB, and chills.  LMP: 09/16/23  Onset of complaint: 03/15/23 Pain score: 8/10 Vitals:   03/15/24 0645 03/15/24 0700  BP: 105/65 97/66  Pulse: (!) 122 (!) 114  Resp:    Temp:  99.5 F (37.5 C)  SpO2:  100%     FHT: 170  Pt reports family has been sick and pt ran fever last night. Pt states she feels faint and has difficulty breathing. Provider to bedside, IV placed, and fluids started.

## 2024-03-15 NOTE — ED Notes (Signed)
Transferred to MAU

## 2024-03-15 NOTE — MAU Note (Signed)
 Laura Schmidt is a 29 y.o. at [redacted]w[redacted]d here in MAU reporting: she was seen early today and back pain has continued to worsen, having SOB, fever, and decreased FM.  Pt diagnosed with flu during earlier visit today.  Reports took Tylenol  1 tab at 1400 and Tamiflu  at noon.  States she Denies VB and LOF.    LMP: 09/17/2023 Onset of complaint: today Pain score: 10 Vitals:   03/15/24 1825  BP: 116/76  Pulse: (!) 146  Resp: 20  Temp: 99.6 F (37.6 C)  SpO2: 97%     FHT: 191 bpm  Lab orders placed from triage: None

## 2024-03-25 ENCOUNTER — Ambulatory Visit (INDEPENDENT_AMBULATORY_CARE_PROVIDER_SITE_OTHER): Payer: Self-pay | Admitting: Obstetrics and Gynecology

## 2024-03-25 ENCOUNTER — Encounter: Payer: Self-pay | Admitting: Obstetrics and Gynecology

## 2024-03-25 ENCOUNTER — Other Ambulatory Visit: Payer: Self-pay

## 2024-03-25 VITALS — BP 115/70 | HR 91 | Wt 167.4 lb

## 2024-03-25 DIAGNOSIS — Z603 Acculturation difficulty: Secondary | ICD-10-CM | POA: Diagnosis not present

## 2024-03-25 DIAGNOSIS — Z3482 Encounter for supervision of other normal pregnancy, second trimester: Secondary | ICD-10-CM | POA: Diagnosis not present

## 2024-03-25 DIAGNOSIS — Z23 Encounter for immunization: Secondary | ICD-10-CM

## 2024-03-25 DIAGNOSIS — Z3A27 27 weeks gestation of pregnancy: Secondary | ICD-10-CM

## 2024-03-25 DIAGNOSIS — Z758 Other problems related to medical facilities and other health care: Secondary | ICD-10-CM

## 2024-03-25 DIAGNOSIS — Z3480 Encounter for supervision of other normal pregnancy, unspecified trimester: Secondary | ICD-10-CM

## 2024-03-25 NOTE — Progress Notes (Signed)
" ° °  PRENATAL VISIT NOTE  Subjective:  Simora Dingee is a 29 y.o. [id number] at [redacted]w[redacted]d being seen today for ongoing prenatal care.  She is currently monitored for the following issues for this low-risk pregnancy and has Language barrier; Supervision of other normal pregnancy, antepartum; and Breast pain, left on their problem list.  Patient reports pressure in lower abdomen, particularly when standing for a long time, very mild pain.  Contractions: Irritability. Vag. Bleeding: None.  Movement: Present. Denies leaking of fluid.   The following portions of the patient's history were reviewed and updated as appropriate: allergies, current medications, past family history, past medical history, past social history, past surgical history and problem list.   Objective:   Vitals:   03/25/24 0920  BP: 115/70  Pulse: 91  Weight: 167 lb 6.4 oz (75.9 kg)    Fetal Status:  Fetal Heart Rate (bpm): 149   Movement: Present    General: Alert, oriented and cooperative. Patient is in no acute distress.  Skin: Skin is warm and dry. No rash noted.   Cardiovascular: Normal heart rate noted  Respiratory: Normal respiratory effort, no problems with respiration noted  Abdomen: Soft, gravid, appropriate for gestational age.  Pain/Pressure: Present     Pelvic: Cervical exam deferred        Extremities: Normal range of motion.  Edema: None  Mental Status: Normal mood and affect. Normal behavior. Normal judgment and thought content.      11/26/2023   10:55 AM 02/21/2020    1:40 PM  Depression screen PHQ 2/9  Decreased Interest 0 0  Down, Depressed, Hopeless 0 0  PHQ - 2 Score 0 0  Altered sleeping 0 0  Tired, decreased energy 1 2  Change in appetite 0 0  Feeling bad or failure about yourself  0 0  Trouble concentrating 0 0  Moving slowly or fidgety/restless 0 0  Suicidal thoughts 0 0  PHQ-9 Score 1  2      Data saved with a previous flowsheet row definition        11/26/2023   10:55 AM  02/21/2020    1:40 PM  GAD 7 : Generalized Anxiety Score  Nervous, Anxious, on Edge 0 0  Control/stop worrying 0 0  Worry too much - different things 0 0  Trouble relaxing 0 0  Restless 0 0  Easily annoyed or irritable 0 0  Afraid - awful might happen 0 0  Total GAD 7 Score 0 0    Assessment and Plan:  Pregnancy: [id number] at [redacted]w[redacted]d  1. Language barrier (Primary) Print production planner used  2. Supervision of other normal pregnancy, antepartum 3rd trim labs, 2 hr GTT Counseled regarding risks/benefits of Tdap vaccine, patient accepts vaccine.   3. [redacted] weeks gestation of pregnancy   Preterm labor symptoms and general obstetric precautions including but not limited to vaginal bleeding, contractions, leaking of fluid and fetal movement were reviewed in detail with the patient. Please refer to After Visit Summary for other counseling recommendations.   Return in about 2 weeks (around 04/08/2024) for low OB.  No future appointments.  Burnard CHRISTELLA Moats, MD  "

## 2024-03-25 NOTE — Addendum Note (Signed)
 Addended by: Dannilynn Gallina V on: 03/25/2024 10:26 AM   Modules accepted: Orders

## 2024-03-25 NOTE — Progress Notes (Signed)
 ROB; GTT labs today  Pt reports pressure in lower abdomen (scale 5 of 10). States its only when she stands for a long time, but would like to discuss with provider.

## 2024-03-26 LAB — CBC
Hematocrit: 34.3 % (ref 34.0–46.6)
Hemoglobin: 11.5 g/dL (ref 11.1–15.9)
MCH: 28.2 pg (ref 26.6–33.0)
MCHC: 33.5 g/dL (ref 31.5–35.7)
MCV: 84 fL (ref 79–97)
Platelets: 299 x10E3/uL (ref 150–450)
RBC: 4.08 x10E6/uL (ref 3.77–5.28)
RDW: 13.5 % (ref 11.7–15.4)
WBC: 8.2 x10E3/uL (ref 3.4–10.8)

## 2024-03-26 LAB — HIV ANTIBODY (ROUTINE TESTING W REFLEX): HIV Screen 4th Generation wRfx: NONREACTIVE

## 2024-03-26 LAB — GLUCOSE TOLERANCE, 2 HOURS W/ 1HR
Glucose, 1 hour: 134 mg/dL (ref 70–179)
Glucose, 2 hour: 132 mg/dL (ref 70–152)
Glucose, Fasting: 83 mg/dL (ref 70–91)

## 2024-03-26 LAB — SYPHILIS: RPR W/REFLEX TO RPR TITER AND TREPONEMAL ANTIBODIES, TRADITIONAL SCREENING AND DIAGNOSIS ALGORITHM: RPR Ser Ql: NONREACTIVE

## 2024-03-29 ENCOUNTER — Ambulatory Visit: Payer: Self-pay | Admitting: Obstetrics and Gynecology

## 2024-04-07 ENCOUNTER — Encounter: Payer: Self-pay | Admitting: Obstetrics and Gynecology

## 2024-04-07 ENCOUNTER — Ambulatory Visit: Payer: Self-pay | Admitting: Obstetrics and Gynecology

## 2024-04-07 VITALS — BP 129/75 | HR 106 | Wt 172.4 lb

## 2024-04-07 DIAGNOSIS — Z603 Acculturation difficulty: Secondary | ICD-10-CM | POA: Diagnosis not present

## 2024-04-07 DIAGNOSIS — Z3480 Encounter for supervision of other normal pregnancy, unspecified trimester: Secondary | ICD-10-CM

## 2024-04-07 DIAGNOSIS — Z3483 Encounter for supervision of other normal pregnancy, third trimester: Secondary | ICD-10-CM

## 2024-04-07 DIAGNOSIS — R058 Other specified cough: Secondary | ICD-10-CM

## 2024-04-07 DIAGNOSIS — Z3A29 29 weeks gestation of pregnancy: Secondary | ICD-10-CM | POA: Diagnosis not present

## 2024-04-07 DIAGNOSIS — Z758 Other problems related to medical facilities and other health care: Secondary | ICD-10-CM | POA: Diagnosis not present

## 2024-04-07 NOTE — Patient Instructions (Signed)
Safe Medications in Pregnancy  ? ? ?Colds/Coughs/Allergies: ?Benadryl (alcohol free) 25 mg every 6 hours as needed ?Breath right strips ?Claritin ?Cepacol throat lozenges ?Chloraseptic throat spray ?Cold-Eeze- up to three times per day ?Cough drops, alcohol free ?Flonase (by prescription only) ?Guaifenesin ?Mucinex ?Robitussin DM (plain only, alcohol free) ?Saline nasal spray/drops ?Sudafed (pseudoephedrine) & Actifed ** use only after [redacted] weeks gestation and if you do not have high blood pressure ?Tylenol ?Vicks Vaporub ?Zinc lozenges ?Zyrtec  ?

## 2024-04-07 NOTE — Progress Notes (Signed)
" ° °  PRENATAL VISIT NOTE  Subjective:  Laura Schmidt is a 29 y.o. H4E7977 at [redacted]w[redacted]d being seen today for ongoing prenatal care.  She is currently monitored for the following issues for this low-risk pregnancy and has Language barrier; Supervision of other normal pregnancy, antepartum; and Breast pain, left on their problem list.  Patient reports dry cough x 1 month. Had episode of leaking when she was coughing, none leaking today    Contractions: Not present. Vag. Bleeding: None.  Movement: Present. Denies leaking of fluid.   The following portions of the patient's history were reviewed and updated as appropriate: allergies, current medications, past family history, past medical history, past social history, past surgical history and problem list.   Objective:   Vitals:   04/07/24 1028  BP: 129/75  Pulse: (!) 106  Weight: 172 lb 6.4 oz (78.2 kg)    Fetal Status:  Fetal Heart Rate (bpm): 155 Fundal Height: 29 cm Movement: Present    General: Alert, oriented and cooperative. Patient is in no acute distress.  Skin: Skin is warm and dry. No rash noted.   Cardiovascular: Normal heart rate noted  Respiratory: Normal respiratory effort, no problems with respiration noted  Abdomen: Soft, gravid, appropriate for gestational age.  Pain/Pressure: Present     Pelvic: Cervical exam deferred        Extremities: Normal range of motion.  Edema: None  Mental Status: Normal mood and affect. Normal behavior. Normal judgment and thought content.   Assessment and Plan:  Pregnancy: [id number] at [redacted]w[redacted]d 1. Supervision of other normal pregnancy, antepartum (Primary) BP and FHR normal   2. Language barrier In person interpreter present   3. [redacted] weeks gestation of pregnancy Follow up growth  3/26 Not continuing to leak fluid, precautions discussed what to look for and when to be seen  4. Dry cough Encourage cough drops, robitussin DM    Preterm labor symptoms and general obstetric precautions  including but not limited to vaginal bleeding, contractions, leaking of fluid and fetal movement were reviewed in detail with the patient. Please refer to After Visit Summary for other counseling recommendations.   Return in two weeks for LOB   Future Appointments  Date Time Provider Department Center  04/26/2024 11:15 AM Wallace Joesph LABOR, PA [facility] None  06/03/2024  9:15 AM [facility] PROVIDER 1 [facility]  06/03/2024  9:30 AM [facility] US1 [facility]    Nidia Daring, FNP  "

## 2024-04-07 NOTE — Progress Notes (Signed)
 Pt presents for ROB visit. Pt c/o watery discharge. Denies color, odor and irritation. Pt also reports a dry cough for 1 month

## 2024-04-12 ENCOUNTER — Encounter: Payer: Self-pay | Admitting: Obstetrics and Gynecology

## 2024-04-26 ENCOUNTER — Encounter: Payer: Self-pay | Admitting: Family Medicine

## 2024-06-03 ENCOUNTER — Ambulatory Visit
